# Patient Record
Sex: Female | Born: 1945 | Race: White | Hispanic: No | Marital: Married | Smoking: Never smoker
Health system: Southern US, Community
[De-identification: ages and names within clinical notes are randomized; demographics above are authoritative.]

## PROBLEM LIST (undated history)

## (undated) DIAGNOSIS — R51 Headache: Secondary | ICD-10-CM

## (undated) DIAGNOSIS — E7800 Pure hypercholesterolemia, unspecified: Secondary | ICD-10-CM

## (undated) HISTORY — PX: OTHER SURGICAL HISTORY: SHX169

## (undated) HISTORY — PX: TUBAL LIGATION: SHX77

## (undated) HISTORY — PX: TRIGGER FINGER RELEASE: SHX641

---

## 2000-12-15 ENCOUNTER — Other Ambulatory Visit: Admission: RE | Admit: 2000-12-15 | Discharge: 2000-12-15 | Payer: Self-pay | Admitting: Family Medicine

## 2000-12-20 ENCOUNTER — Ambulatory Visit (HOSPITAL_COMMUNITY): Admission: RE | Admit: 2000-12-20 | Discharge: 2000-12-20 | Payer: Self-pay | Admitting: Family Medicine

## 2000-12-20 ENCOUNTER — Encounter: Payer: Self-pay | Admitting: Family Medicine

## 2002-02-28 ENCOUNTER — Encounter: Payer: Self-pay | Admitting: Family Medicine

## 2002-02-28 ENCOUNTER — Ambulatory Visit (HOSPITAL_COMMUNITY): Admission: RE | Admit: 2002-02-28 | Discharge: 2002-02-28 | Payer: Self-pay | Admitting: Family Medicine

## 2003-03-04 ENCOUNTER — Encounter: Payer: Self-pay | Admitting: Internal Medicine

## 2003-03-04 ENCOUNTER — Ambulatory Visit (HOSPITAL_COMMUNITY): Admission: RE | Admit: 2003-03-04 | Discharge: 2003-03-04 | Payer: Self-pay | Admitting: Internal Medicine

## 2003-03-18 ENCOUNTER — Ambulatory Visit (HOSPITAL_COMMUNITY): Admission: RE | Admit: 2003-03-18 | Discharge: 2003-03-18 | Payer: Self-pay | Admitting: Internal Medicine

## 2003-03-20 ENCOUNTER — Other Ambulatory Visit: Admission: RE | Admit: 2003-03-20 | Discharge: 2003-03-20 | Payer: Self-pay | Admitting: Obstetrics and Gynecology

## 2003-07-01 ENCOUNTER — Ambulatory Visit (HOSPITAL_COMMUNITY): Admission: RE | Admit: 2003-07-01 | Discharge: 2003-07-01 | Payer: Self-pay | Admitting: Internal Medicine

## 2004-03-05 ENCOUNTER — Ambulatory Visit (HOSPITAL_COMMUNITY): Admission: RE | Admit: 2004-03-05 | Discharge: 2004-03-05 | Payer: Self-pay | Admitting: Internal Medicine

## 2005-02-22 ENCOUNTER — Other Ambulatory Visit: Admission: RE | Admit: 2005-02-22 | Discharge: 2005-02-22 | Payer: Self-pay | Admitting: Dermatology

## 2005-04-27 ENCOUNTER — Ambulatory Visit (HOSPITAL_COMMUNITY): Admission: RE | Admit: 2005-04-27 | Discharge: 2005-04-27 | Payer: Self-pay | Admitting: Obstetrics and Gynecology

## 2005-09-03 ENCOUNTER — Ambulatory Visit (HOSPITAL_COMMUNITY): Admission: RE | Admit: 2005-09-03 | Discharge: 2005-09-03 | Payer: Self-pay | Admitting: Internal Medicine

## 2006-05-30 ENCOUNTER — Ambulatory Visit (HOSPITAL_COMMUNITY): Admission: RE | Admit: 2006-05-30 | Discharge: 2006-05-30 | Payer: Self-pay | Admitting: Obstetrics and Gynecology

## 2007-06-01 ENCOUNTER — Ambulatory Visit (HOSPITAL_COMMUNITY): Admission: RE | Admit: 2007-06-01 | Discharge: 2007-06-01 | Payer: Self-pay | Admitting: Obstetrics and Gynecology

## 2008-12-03 ENCOUNTER — Ambulatory Visit (HOSPITAL_COMMUNITY): Admission: RE | Admit: 2008-12-03 | Discharge: 2008-12-03 | Payer: Self-pay | Admitting: Obstetrics and Gynecology

## 2010-01-23 ENCOUNTER — Ambulatory Visit (HOSPITAL_COMMUNITY): Admission: RE | Admit: 2010-01-23 | Discharge: 2010-01-23 | Payer: Self-pay | Admitting: Unknown Physician Specialty

## 2010-05-06 ENCOUNTER — Ambulatory Visit (HOSPITAL_COMMUNITY)
Admission: RE | Admit: 2010-05-06 | Discharge: 2010-05-06 | Payer: Self-pay | Source: Home / Self Care | Attending: Unknown Physician Specialty | Admitting: Unknown Physician Specialty

## 2010-10-09 NOTE — Op Note (Signed)
NAME:  Sue Evans, Sue Evans                        ACCOUNT NO.:  [id number]   MEDICAL RECORD NO.:  [id number]                   PATIENT TYPE:  AMB   LOCATION:  DAY                                  FACILITY:  [facility]   PHYSICIAN:  R. Roetta Sessions, M.D.              DATE OF BIRTH:  19-Jun-1945   DATE OF PROCEDURE:  03/18/2003  DATE OF DISCHARGE:                                 OPERATIVE REPORT   PROCEDURE:  EGD followed by colonoscopy.   INDICATIONS FOR PROCEDURE:  The patient is a 65 year old lady, who was found  to have mild microcytic anemia and three out of three Hemoccult cards  returned positive.  She has been on iron supplementation.  H. pylori  serologies were positive through [employer]. Sherwood Gambler, M.D.'s office.  EGD and  colonoscopy are now being done.  This approach has been discussed with the  patient previously and again today at the bedside.  Potential risks,  benefits, and alternatives have been reviewed.  Please see my dictated  consultation note for more information.   PROCEDURE NOTE:  O2 saturations, blood pressure, pulse, and respirations  were monitored throughout the entire procedure.   CONSCIOUS SEDATION:  Demerol 75 mg IV, Versed 4 mg IV in divided doses.   INSTRUMENT:  Olympus video chip adult gastroscope and colonoscope.   ESOPHAGOGASTRODUODENOSCOPY FINDINGS:  Examination of the tubular esophagus  revealed no mucosal abnormalities.  EG junction easily traversed.   STOMACH:  The gastric cavity was emptied, insufflated well with air.  Thorough examination of the gastric mucosa, including retroflexed view of  the proximal stomach and esophagogastric junction demonstrated only a couple  of small antral polyps (3-4 mm).  There was a small hiatal hernia.  Gastric  mucosa otherwise appeared normal.  Pylorus patent and easily traversed.   DUODENUM:  The bulb and second portion appeared normal.   THERAPY/DIAGNOSTIC MANEUVERS PERFORMED:  None.   The patient tolerated  the procedure well and was prepared for colonoscopy.  Digital rectal exam revealed no abnormalities or endoscopic findings.  The  prep was good.   RECTUM:  Examination of the rectal mucosa including retroflexed view in the  anal verge revealed no abnormalities.   COLON:  Colonic mucosa was surveyed from the rectosigmoid junction through  the left transverse and right colon to the area of the appendiceal orifice,  ileocecal valve, and cecum.  These structures were well seen and  photographed for the record.  Colonic mucosa to the cecum appeared normal.  From the level of the cecum and ileocecal valve, the scope was slowly  withdrawn, and previously mentioned mucosal surfaces were again seen, and no  abnormalities were observed.  The patient tolerated both procedures well and  was reacted in endoscopy.   IMPRESSION:  EGD:  A couple of very small antral polyps, small hiatal  hernia.  Remainder of upper gastrointestinal tract appeared normal.  Colonoscopy findings:  Normal rectum, normal colon.   RECOMMENDATIONS:  1. Today's findings are reassuring.  She does have positive H. pylori     serologies, and she is being treated for H. pylori, H. pylori class I     carcinogen, and these will be treated even though she does not have any     endoscopic evidence of any inflammatory changes involving the gastric     mucosa.  2. Prev-Pak x 14 days.  3. Have this nice lady to come back to see [place] in the office in six weeks     just to see how she is doing.  We will recheck a CBC at that time.  If     she were to display evidence of further gastrointestinal bleeding, would     consider [facility]'s imaging capsule study.      ___________________________________________                                            Jonathon Bellows, M.D.   RMR/MEDQ  D:  03/18/2003  T:  03/18/2003  Job:  [id number]   cc:   Madelin Rear. Sherwood Gambler, M.D.  [address]  [place]  [place] [address]  Fax: [phone]

## 2011-03-19 ENCOUNTER — Other Ambulatory Visit (HOSPITAL_COMMUNITY): Payer: Self-pay | Admitting: Obstetrics and Gynecology

## 2011-03-19 DIAGNOSIS — Z139 Encounter for screening, unspecified: Secondary | ICD-10-CM

## 2011-05-21 ENCOUNTER — Other Ambulatory Visit (HOSPITAL_COMMUNITY): Payer: Self-pay | Admitting: Obstetrics and Gynecology

## 2011-05-21 ENCOUNTER — Ambulatory Visit (HOSPITAL_COMMUNITY)
Admission: RE | Admit: 2011-05-21 | Discharge: 2011-05-21 | Disposition: A | Discharge status: Home / Self Care | Payer: PRIVATE HEALTH INSURANCE | Source: Ambulatory Visit | Attending: Obstetrics and Gynecology | Admitting: Obstetrics and Gynecology

## 2011-05-21 DIAGNOSIS — Z139 Encounter for screening, unspecified: Secondary | ICD-10-CM

## 2011-05-21 DIAGNOSIS — Z09 Encounter for follow-up examination after completed treatment for conditions other than malignant neoplasm: Secondary | ICD-10-CM

## 2011-06-09 ENCOUNTER — Other Ambulatory Visit (HOSPITAL_COMMUNITY): Payer: Self-pay | Admitting: Obstetrics and Gynecology

## 2011-06-09 ENCOUNTER — Ambulatory Visit (HOSPITAL_COMMUNITY)
Admission: RE | Admit: 2011-06-09 | Discharge: 2011-06-09 | Disposition: A | Discharge status: Home / Self Care | Payer: PRIVATE HEALTH INSURANCE | Source: Ambulatory Visit | Attending: Obstetrics and Gynecology | Admitting: Obstetrics and Gynecology

## 2011-06-09 DIAGNOSIS — Z09 Encounter for follow-up examination after completed treatment for conditions other than malignant neoplasm: Secondary | ICD-10-CM

## 2011-06-09 DIAGNOSIS — N63 Unspecified lump in unspecified breast: Secondary | ICD-10-CM | POA: Insufficient documentation

## 2012-04-11 LAB — CBC
HCT: 39 %
Hemoglobin: 13.2 g/dL (ref 12.0–16.0)

## 2012-05-09 ENCOUNTER — Telehealth: Payer: Self-pay

## 2012-05-09 NOTE — Telephone Encounter (Signed)
Pt was referred by Dr. Sherwood Gambler for screening colonoscopy. She had her last one in 03/18/2003 with Dr. Jena Gauss when she had an EGD. She is not having any problems now and I informed her that probably her insurance would not cover until 02/2013. She said she will wait til then and call if she has any new problems.

## 2012-05-10 NOTE — Telephone Encounter (Signed)
This patient has h/o microcytic anemia back in 2004 when we saw her for her procedures.   I would recommend we verify with PCP that patient is not anemic or heme positive.

## 2012-05-15 NOTE — Telephone Encounter (Signed)
Faxed the info to Dr. Sherwood Gambler to send back a reply.

## 2012-05-15 NOTE — Telephone Encounter (Signed)
Lab results placed on Leslie's cart.

## 2012-05-15 NOTE — Telephone Encounter (Signed)
Reviewed labs from PCP dated 04/11/12. Her H/H were 13.2/38.8.   No signs of anemia. Patient can wait until 10 years if she would like.  Other option, is to have her discuss with her insurance company. They may cover her screening colonoscopy any time in 2014 and not make her wait till 02/2013.

## 2012-05-16 NOTE — Telephone Encounter (Signed)
LMOM to call.

## 2012-05-16 NOTE — Telephone Encounter (Signed)
Returned pt's call and LMOM again to call.

## 2012-05-16 NOTE — Telephone Encounter (Signed)
Pt returned call and was informed. She will think about it and let us know if she decides to do earlier.

## 2012-06-20 ENCOUNTER — Other Ambulatory Visit (HOSPITAL_COMMUNITY): Payer: Self-pay | Admitting: Internal Medicine

## 2012-06-20 DIAGNOSIS — Z139 Encounter for screening, unspecified: Secondary | ICD-10-CM

## 2012-06-22 ENCOUNTER — Ambulatory Visit (HOSPITAL_COMMUNITY)
Admission: RE | Admit: 2012-06-22 | Discharge: 2012-06-22 | Disposition: A | Discharge status: Home / Self Care | Payer: PRIVATE HEALTH INSURANCE | Source: Ambulatory Visit | Attending: Internal Medicine | Admitting: Internal Medicine

## 2012-06-22 DIAGNOSIS — Z1231 Encounter for screening mammogram for malignant neoplasm of breast: Secondary | ICD-10-CM | POA: Insufficient documentation

## 2012-06-22 DIAGNOSIS — Z139 Encounter for screening, unspecified: Secondary | ICD-10-CM

## 2013-02-14 ENCOUNTER — Telehealth: Payer: Self-pay

## 2013-02-15 ENCOUNTER — Other Ambulatory Visit: Payer: Self-pay

## 2013-02-15 DIAGNOSIS — Z1211 Encounter for screening for malignant neoplasm of colon: Secondary | ICD-10-CM

## 2013-02-16 ENCOUNTER — Encounter (HOSPITAL_COMMUNITY): Payer: Self-pay | Admitting: Pharmacy Technician

## 2013-02-20 MED ORDER — PEG-KCL-NACL-NASULF-NA ASC-C 100 G PO SOLR: 1.0000 | ORAL | Status: DC

## 2013-02-20 NOTE — Telephone Encounter (Signed)
Gastroenterology Pre-Procedure Review  Request Date: 02/14/2013 Requesting Physician: Dr. Sherwood Gambler  PATIENT REVIEW QUESTIONS: The patient responded to the following health history questions as indicated:    1. Diabetes Melitis: no 2. Joint replacements in the past 12 months: no 3. Major health problems in the past 3 months: no 4. Has an artificial valve or MVP: no 5. Has a defibrillator: no 6. Has been advised in past to take antibiotics in advance of a procedure like teeth cleaning: no    MEDICATIONS & ALLERGIES:    Patient reports the following regarding taking any blood thinners:   Plavix? no Aspirin? no Coumadin? no  Patient confirms/reports the following medications:  Current Outpatient Prescriptions  Medication Sig Dispense Refill  . calcium carbonate (OS-CAL) 1250 MG chewable tablet Chew 1 tablet by mouth daily.      Marland Kitchen lisinopril (PRINIVIL,ZESTRIL) 10 MG tablet Take 10 mg by mouth daily.      Marland Kitchen omega-3 acid ethyl esters (LOVAZA) 1 G capsule Take 1 g by mouth daily.      . pravastatin (PRAVACHOL) 20 MG tablet Take 20 mg by mouth daily.       No current facility-administered medications for this visit.    Patient confirms/reports the following allergies:  No Known Allergies  No orders of the defined types were placed in this encounter.    AUTHORIZATION INFORMATION Primary Insurance:   ID #:   Group #:  Pre-Cert / Auth required:  Pre-Cert / Auth #:   Secondary Insurance:   ID #:   Group #:  Pre-Cert / Auth required: Pre-Cert / Auth #:   SCHEDULE INFORMATION: Procedure has been scheduled as follows:  Date: 03/05/2013        Time:  9:30 AM Location: [facility] Short Stay  This Gastroenterology Pre-Precedure Review Form is being routed to the following provider(s): R. Roetta Sessions, MD

## 2013-02-20 NOTE — Telephone Encounter (Signed)
Ok to schedule.

## 2013-02-20 NOTE — Telephone Encounter (Signed)
Rx sent to the pharmacy and instructions mailed to pt.  

## 2013-02-27 ENCOUNTER — Telehealth: Payer: Self-pay

## 2013-02-27 NOTE — Telephone Encounter (Signed)
I called [employer] at [phone]. Went through the automation and NO PA IS REQUIRED for screening colonoscopy.  I called [employer] ( Secondary) at [phone] and spoke to [place] who said that a PA is not required for a screening colonoscopy.

## 2013-03-05 ENCOUNTER — Ambulatory Visit (HOSPITAL_COMMUNITY)
Admission: RE | Admit: 2013-03-05 | Discharge: 2013-03-05 | Disposition: A | Discharge status: Home Health | Payer: Medicare PPO | Source: Ambulatory Visit | Attending: Internal Medicine | Admitting: Internal Medicine

## 2013-03-05 ENCOUNTER — Encounter (HOSPITAL_COMMUNITY): Payer: Self-pay | Admitting: *Deleted

## 2013-03-05 ENCOUNTER — Encounter (HOSPITAL_COMMUNITY)
Admission: RE | Disposition: A | Discharge status: Home Health | Payer: Self-pay | Source: Ambulatory Visit | Attending: Internal Medicine

## 2013-03-05 DIAGNOSIS — K5730 Diverticulosis of large intestine without perforation or abscess without bleeding: Secondary | ICD-10-CM

## 2013-03-05 DIAGNOSIS — Z1211 Encounter for screening for malignant neoplasm of colon: Secondary | ICD-10-CM | POA: Insufficient documentation

## 2013-03-05 DIAGNOSIS — K648 Other hemorrhoids: Secondary | ICD-10-CM

## 2013-03-05 HISTORY — PX: COLONOSCOPY: SHX5424

## 2013-03-05 HISTORY — DX: Headache: R51

## 2013-03-05 HISTORY — DX: Pure hypercholesterolemia, unspecified: E78.00

## 2013-03-05 SURGERY — COLONOSCOPY
Anesthesia: Moderate Sedation

## 2013-03-05 MED ORDER — STERILE WATER FOR IRRIGATION IR SOLN
Status: DC | PRN
  Administered 2013-03-05: 10:00:00

## 2013-03-05 MED ORDER — ONDANSETRON HCL 4 MG/2ML IJ SOLN
INTRAMUSCULAR | Status: AC
  Filled 2013-03-05: qty 2

## 2013-03-05 MED ORDER — MEPERIDINE HCL 100 MG/ML IJ SOLN
INTRAMUSCULAR | Status: AC
  Filled 2013-03-05: qty 2

## 2013-03-05 MED ORDER — MIDAZOLAM HCL 5 MG/5ML IJ SOLN
INTRAMUSCULAR | Status: AC
  Filled 2013-03-05: qty 10

## 2013-03-05 MED ORDER — MEPERIDINE HCL 100 MG/ML IJ SOLN
INTRAMUSCULAR | Status: DC | PRN
  Administered 2013-03-05: 50 mg via INTRAVENOUS
  Administered 2013-03-05: 25 mg via INTRAVENOUS

## 2013-03-05 MED ORDER — SODIUM CHLORIDE 0.9 % IV SOLN
INTRAVENOUS | Status: DC
  Administered 2013-03-05: 1000 mL via INTRAVENOUS

## 2013-03-05 MED ORDER — MIDAZOLAM HCL 5 MG/5ML IJ SOLN
INTRAMUSCULAR | Status: DC | PRN
  Administered 2013-03-05: 1 mg via INTRAVENOUS
  Administered 2013-03-05: 2 mg via INTRAVENOUS

## 2013-03-05 MED ORDER — MIDAZOLAM HCL 5 MG/5ML IJ SOLN
INTRAMUSCULAR | Status: AC
  Filled 2013-03-05: qty 5

## 2013-03-05 MED ORDER — ONDANSETRON HCL 4 MG/2ML IJ SOLN
INTRAMUSCULAR | Status: DC | PRN
  Administered 2013-03-05: 4 mg via INTRAVENOUS

## 2013-03-05 NOTE — Op Note (Signed)
[facility] [address] [place], [address]   COLONOSCOPY PROCEDURE REPORT  PATIENT: Sue Evans, Sue Evans  MR#:         [id number] BIRTHDATE: 21-Feb-1946 , 67  yrs. old GENDER: Female ENDOSCOPIST: R.  Roetta Sessions, MD FACP FACG REFERRED BY:  Artis Delay, M.D. PROCEDURE DATE:  03/05/2013 PROCEDURE:     Screening colonoscopy  INDICATIONS: Average risk colorectal cancer screening examination  INFORMED CONSENT:  The risks, benefits, alternatives and imponderables including but not limited to bleeding, perforation as well as the possibility of a missed lesion have been reviewed.  The potential for biopsy, lesion removal, etc. have also been discussed.  Questions have been answered.  All parties agreeable. Please see the history and physical in the medical record for more information.  MEDICATIONS: Versed 3 mg IV and Demerol 75 mg IV in divided doses. Zofran 4 mg IV  DESCRIPTION OF PROCEDURE:  After a digital rectal exam was performed, the EC-3890Li ([id number])  colonoscope was advanced from the anus through the rectum and colon to the area of the cecum, ileocecal valve and appendiceal orifice.  The cecum was deeply intubated.  These structures were well-seen and photographed for the record.  From the level of the cecum and ileocecal valve, the scope was slowly and cautiously withdrawn.  The mucosal surfaces were carefully surveyed utilizing scope tip deflection to facilitate fold flattening as needed.  The scope was pulled down into the rectum where a thorough examination including retroflexion was performed.    FINDINGS:  Adequate preparation. Minimal internal hemorrhoids; otherwise, normal rectum. Scattered sigmoid diverticula; the remainder of the colonic mucosa appeared normal.  THERAPEUTIC / DIAGNOSTIC MANEUVERS PERFORMED:  None  COMPLICATIONS: none  CECAL WITHDRAWAL TIME:  none  IMPRESSION:  Colonic diverticulosis  RECOMMENDATIONS: Return for one  more screening colonoscopy in 10 years.   _______________________________ eSigned:  R. Roetta Sessions, MD FACP [facility] 03/05/2013 10:07 AM   CC:

## 2013-03-05 NOTE — H&P (Signed)
Primary Care Physician:  Cassell Smiles., MD Primary Gastroenterologist:  Dr. Jena Gauss  Pre-Procedure History & Physical: HPI:  Sue Evans is a 67 y.o. female is here for a screening colonoscopy. Reported negative colonoscopy 10 years ago (records not available). Patient presents for a screening colonoscopy at this time. No bowel symptoms. No family history colon polyps or colon cancer.  Past Medical History  Diagnosis Date  . Dysrhythmia   . Headache(784.0)   . Hypercholesterolemia     Past Surgical History  Procedure Laterality Date  . Trigger finger release    . Tubal ligation    . 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 Authorizing Provider  calcium carbonate (OS-CAL) 1250 MG chewable tablet Chew 1 tablet by mouth daily.   Yes Historical Provider, MD  lisinopril (PRINIVIL,ZESTRIL) 10 MG tablet Take 10 mg by mouth daily.   Yes Historical Provider, MD  omega-3 acid ethyl esters (LOVAZA) 1 G capsule Take 1 g by mouth daily.   Yes Historical Provider, MD  peg 3350 powder (MOVIPREP) 100 G SOLR Take 1 kit (200 g total) by mouth as directed. 02/20/13  Yes Corbin Ade, MD  pravastatin (PRAVACHOL) 20 MG tablet Take 20 mg by mouth daily.   Yes Historical Provider, MD    Allergies as of 02/15/2013  . (Not on File)    Family History  Problem Relation Age of Onset  . Heart disease Mother   . Stroke Father   . Atrial fibrillation Father   . Heart disease Sister   . Stroke Sister     History   Social History  . Marital Status: Married    Spouse Name: N/A    Number of Children: N/A  . Years of Education: N/A   Occupational History  . Not on file.   Social History Main Topics  . Smoking status: Never Smoker   . Smokeless tobacco: Not on file  . Alcohol Use: No  . Drug Use: No  . Sexual Activity: Not on file   Other Topics Concern  . Not on file   Social History Narrative  . No  narrative on file    Review of Systems: See HPI, otherwise negative ROS  Physical Exam: BP 123/71  Pulse 56  Temp(Src) 97.8 F (36.6 C) (Oral)  Resp 20  Ht 5\' 4"  (1.626 m)  Wt 165 lb (74.844 kg)  BMI 28.31 kg/m2  SpO2 93% General:   Alert,  Well-developed, well-nourished, pleasant and cooperative in NAD Head:  Normocephalic and atraumatic. Eyes:  Sclera clear, no icterus.   Conjunctiva pink. Ears:  Normal auditory acuity. Nose:  No deformity, discharge,  or lesions. Mouth:  No deformity or lesions, dentition normal. Neck:  Supple; no masses or thyromegaly. Lungs:  Clear throughout to auscultation.   No wheezes, crackles, or rhonchi. No acute distress. Heart:  Regular rate and rhythm; no murmurs, clicks, rubs,  or gallops. Abdomen:  Soft, nontender and nondistended. No masses, hepatosplenomegaly or hernias noted. Normal bowel sounds, without guarding, and without rebound.   Msk:  Symmetrical without gross deformities. Normal posture. Pulses:  Normal pulses noted. Extremities:  Without clubbing or edema. Neurologic:  Alert and  oriented x4;  grossly normal neurologically. Skin:  Intact without significant lesions or rashes. Cervical Nodes:  No significant cervical adenopathy. Psych:  Alert and cooperative. Normal mood and affect.  Impression/Plan: [name] is now here  to undergo a screening colonoscopy.  Average risk screening examination  Risks, benefits, limitations, imponderables and alternatives regarding colonoscopy have been reviewed with the patient. Questions have been answered. All parties agreeable.

## 2013-03-06 ENCOUNTER — Encounter (HOSPITAL_COMMUNITY): Payer: Self-pay | Admitting: Internal Medicine

## 2013-07-19 ENCOUNTER — Other Ambulatory Visit (HOSPITAL_COMMUNITY): Payer: Self-pay | Admitting: Internal Medicine

## 2013-07-19 DIAGNOSIS — Z1231 Encounter for screening mammogram for malignant neoplasm of breast: Secondary | ICD-10-CM

## 2013-07-30 ENCOUNTER — Ambulatory Visit (HOSPITAL_COMMUNITY)
Admission: RE | Admit: 2013-07-30 | Discharge: 2013-07-30 | Disposition: A | Discharge status: Home / Self Care | Payer: Medicare PPO | Source: Ambulatory Visit | Attending: Internal Medicine | Admitting: Internal Medicine

## 2013-07-30 DIAGNOSIS — Z1231 Encounter for screening mammogram for malignant neoplasm of breast: Secondary | ICD-10-CM | POA: Insufficient documentation

## 2014-08-27 DIAGNOSIS — L570 Actinic keratosis: Secondary | ICD-10-CM | POA: Diagnosis not present

## 2014-08-27 DIAGNOSIS — D235 Other benign neoplasm of skin of trunk: Secondary | ICD-10-CM | POA: Diagnosis not present

## 2014-08-27 DIAGNOSIS — D485 Neoplasm of uncertain behavior of skin: Secondary | ICD-10-CM | POA: Diagnosis not present

## 2014-09-09 ENCOUNTER — Other Ambulatory Visit (HOSPITAL_COMMUNITY): Payer: Self-pay | Admitting: Internal Medicine

## 2014-09-09 DIAGNOSIS — Z1231 Encounter for screening mammogram for malignant neoplasm of breast: Secondary | ICD-10-CM

## 2014-10-09 ENCOUNTER — Ambulatory Visit (HOSPITAL_COMMUNITY)
Admission: RE | Admit: 2014-10-09 | Discharge: 2014-10-09 | Disposition: A | Discharge status: Home / Self Care | Payer: Medicare PPO | Source: Ambulatory Visit | Attending: Internal Medicine | Admitting: Internal Medicine

## 2014-10-09 DIAGNOSIS — Z1231 Encounter for screening mammogram for malignant neoplasm of breast: Secondary | ICD-10-CM | POA: Insufficient documentation

## 2014-11-01 ENCOUNTER — Ambulatory Visit (HOSPITAL_COMMUNITY)
Admission: RE | Admit: 2014-11-01 | Discharge: 2014-11-01 | Disposition: A | Discharge status: Home / Self Care | Payer: Medicare PPO | Source: Ambulatory Visit | Attending: Physician Assistant | Admitting: Physician Assistant

## 2014-11-01 ENCOUNTER — Other Ambulatory Visit (HOSPITAL_COMMUNITY): Payer: Self-pay | Admitting: Physician Assistant

## 2014-11-01 DIAGNOSIS — M25461 Effusion, right knee: Secondary | ICD-10-CM | POA: Diagnosis not present

## 2014-11-01 DIAGNOSIS — M25561 Pain in right knee: Secondary | ICD-10-CM | POA: Diagnosis not present

## 2014-11-01 DIAGNOSIS — S8991XA Unspecified injury of right lower leg, initial encounter: Secondary | ICD-10-CM | POA: Diagnosis not present

## 2015-02-25 DIAGNOSIS — L570 Actinic keratosis: Secondary | ICD-10-CM | POA: Diagnosis not present

## 2015-02-25 DIAGNOSIS — D1800 Hemangioma unspecified site: Secondary | ICD-10-CM | POA: Diagnosis not present

## 2015-02-25 DIAGNOSIS — L84 Corns and callosities: Secondary | ICD-10-CM | POA: Diagnosis not present

## 2015-04-16 DIAGNOSIS — M1711 Unilateral primary osteoarthritis, right knee: Secondary | ICD-10-CM | POA: Diagnosis not present

## 2015-04-16 DIAGNOSIS — M25561 Pain in right knee: Secondary | ICD-10-CM | POA: Diagnosis not present

## 2015-04-21 ENCOUNTER — Other Ambulatory Visit (HOSPITAL_COMMUNITY): Payer: Self-pay | Admitting: Orthopedic Surgery

## 2015-04-21 DIAGNOSIS — M25561 Pain in right knee: Secondary | ICD-10-CM

## 2015-05-05 ENCOUNTER — Ambulatory Visit (HOSPITAL_COMMUNITY)
Admission: RE | Admit: 2015-05-05 | Discharge: 2015-05-05 | Disposition: A | Discharge status: Home / Self Care | Payer: Medicare PPO | Source: Ambulatory Visit | Attending: Orthopedic Surgery | Admitting: Orthopedic Surgery

## 2015-05-05 DIAGNOSIS — X58XXXA Exposure to other specified factors, initial encounter: Secondary | ICD-10-CM | POA: Diagnosis not present

## 2015-05-05 DIAGNOSIS — M25561 Pain in right knee: Secondary | ICD-10-CM

## 2015-05-05 DIAGNOSIS — W1830XA Fall on same level, unspecified, initial encounter: Secondary | ICD-10-CM | POA: Diagnosis not present

## 2015-05-05 DIAGNOSIS — S83281A Other tear of lateral meniscus, current injury, right knee, initial encounter: Secondary | ICD-10-CM | POA: Insufficient documentation

## 2015-05-05 DIAGNOSIS — S83241A Other tear of medial meniscus, current injury, right knee, initial encounter: Secondary | ICD-10-CM | POA: Diagnosis not present

## 2015-05-08 DIAGNOSIS — S83281D Other tear of lateral meniscus, current injury, right knee, subsequent encounter: Secondary | ICD-10-CM | POA: Diagnosis not present

## 2015-05-28 DIAGNOSIS — Y939 Activity, unspecified: Secondary | ICD-10-CM | POA: Diagnosis not present

## 2015-05-28 DIAGNOSIS — M6751 Plica syndrome, right knee: Secondary | ICD-10-CM | POA: Diagnosis not present

## 2015-05-28 DIAGNOSIS — Y929 Unspecified place or not applicable: Secondary | ICD-10-CM | POA: Diagnosis not present

## 2015-05-28 DIAGNOSIS — Y999 Unspecified external cause status: Secondary | ICD-10-CM | POA: Diagnosis not present

## 2015-05-28 DIAGNOSIS — S83241A Other tear of medial meniscus, current injury, right knee, initial encounter: Secondary | ICD-10-CM | POA: Diagnosis not present

## 2015-05-28 DIAGNOSIS — M94261 Chondromalacia, right knee: Secondary | ICD-10-CM | POA: Diagnosis not present

## 2015-05-28 DIAGNOSIS — S83281A Other tear of lateral meniscus, current injury, right knee, initial encounter: Secondary | ICD-10-CM | POA: Diagnosis not present

## 2015-06-06 DIAGNOSIS — R2689 Other abnormalities of gait and mobility: Secondary | ICD-10-CM | POA: Diagnosis not present

## 2015-06-06 DIAGNOSIS — M25561 Pain in right knee: Secondary | ICD-10-CM | POA: Diagnosis not present

## 2015-06-06 DIAGNOSIS — S83281D Other tear of lateral meniscus, current injury, right knee, subsequent encounter: Secondary | ICD-10-CM | POA: Diagnosis not present

## 2015-06-06 DIAGNOSIS — S83241D Other tear of medial meniscus, current injury, right knee, subsequent encounter: Secondary | ICD-10-CM | POA: Diagnosis not present

## 2015-06-10 DIAGNOSIS — S83281D Other tear of lateral meniscus, current injury, right knee, subsequent encounter: Secondary | ICD-10-CM | POA: Diagnosis not present

## 2015-06-10 DIAGNOSIS — M25561 Pain in right knee: Secondary | ICD-10-CM | POA: Diagnosis not present

## 2015-06-10 DIAGNOSIS — R2689 Other abnormalities of gait and mobility: Secondary | ICD-10-CM | POA: Diagnosis not present

## 2015-06-10 DIAGNOSIS — S83241D Other tear of medial meniscus, current injury, right knee, subsequent encounter: Secondary | ICD-10-CM | POA: Diagnosis not present

## 2015-06-12 DIAGNOSIS — S83281D Other tear of lateral meniscus, current injury, right knee, subsequent encounter: Secondary | ICD-10-CM | POA: Diagnosis not present

## 2015-06-12 DIAGNOSIS — M25561 Pain in right knee: Secondary | ICD-10-CM | POA: Diagnosis not present

## 2015-06-12 DIAGNOSIS — R2689 Other abnormalities of gait and mobility: Secondary | ICD-10-CM | POA: Diagnosis not present

## 2015-06-12 DIAGNOSIS — S83241D Other tear of medial meniscus, current injury, right knee, subsequent encounter: Secondary | ICD-10-CM | POA: Diagnosis not present

## 2015-06-17 DIAGNOSIS — R2689 Other abnormalities of gait and mobility: Secondary | ICD-10-CM | POA: Diagnosis not present

## 2015-06-17 DIAGNOSIS — S83241D Other tear of medial meniscus, current injury, right knee, subsequent encounter: Secondary | ICD-10-CM | POA: Diagnosis not present

## 2015-06-17 DIAGNOSIS — M25561 Pain in right knee: Secondary | ICD-10-CM | POA: Diagnosis not present

## 2015-06-17 DIAGNOSIS — S83281D Other tear of lateral meniscus, current injury, right knee, subsequent encounter: Secondary | ICD-10-CM | POA: Diagnosis not present

## 2015-06-19 DIAGNOSIS — S83281D Other tear of lateral meniscus, current injury, right knee, subsequent encounter: Secondary | ICD-10-CM | POA: Diagnosis not present

## 2015-06-19 DIAGNOSIS — S83241D Other tear of medial meniscus, current injury, right knee, subsequent encounter: Secondary | ICD-10-CM | POA: Diagnosis not present

## 2015-06-19 DIAGNOSIS — R2689 Other abnormalities of gait and mobility: Secondary | ICD-10-CM | POA: Diagnosis not present

## 2015-06-19 DIAGNOSIS — M25561 Pain in right knee: Secondary | ICD-10-CM | POA: Diagnosis not present

## 2015-06-23 DIAGNOSIS — Z23 Encounter for immunization: Secondary | ICD-10-CM | POA: Diagnosis not present

## 2015-06-23 DIAGNOSIS — Z6829 Body mass index (BMI) 29.0-29.9, adult: Secondary | ICD-10-CM | POA: Diagnosis not present

## 2015-06-23 DIAGNOSIS — Z79899 Other long term (current) drug therapy: Secondary | ICD-10-CM | POA: Diagnosis not present

## 2015-06-23 DIAGNOSIS — E663 Overweight: Secondary | ICD-10-CM | POA: Diagnosis not present

## 2015-06-23 DIAGNOSIS — Z1389 Encounter for screening for other disorder: Secondary | ICD-10-CM | POA: Diagnosis not present

## 2015-06-23 DIAGNOSIS — E782 Mixed hyperlipidemia: Secondary | ICD-10-CM | POA: Diagnosis not present

## 2015-06-23 DIAGNOSIS — I10 Essential (primary) hypertension: Secondary | ICD-10-CM | POA: Diagnosis not present

## 2015-06-23 DIAGNOSIS — Z0001 Encounter for general adult medical examination with abnormal findings: Secondary | ICD-10-CM | POA: Diagnosis not present

## 2015-06-24 DIAGNOSIS — S83281D Other tear of lateral meniscus, current injury, right knee, subsequent encounter: Secondary | ICD-10-CM | POA: Diagnosis not present

## 2015-06-24 DIAGNOSIS — M25561 Pain in right knee: Secondary | ICD-10-CM | POA: Diagnosis not present

## 2015-06-24 DIAGNOSIS — R2689 Other abnormalities of gait and mobility: Secondary | ICD-10-CM | POA: Diagnosis not present

## 2015-06-24 DIAGNOSIS — S83241D Other tear of medial meniscus, current injury, right knee, subsequent encounter: Secondary | ICD-10-CM | POA: Diagnosis not present

## 2015-06-26 DIAGNOSIS — S83241D Other tear of medial meniscus, current injury, right knee, subsequent encounter: Secondary | ICD-10-CM | POA: Diagnosis not present

## 2015-06-26 DIAGNOSIS — M25561 Pain in right knee: Secondary | ICD-10-CM | POA: Diagnosis not present

## 2015-06-26 DIAGNOSIS — S83281D Other tear of lateral meniscus, current injury, right knee, subsequent encounter: Secondary | ICD-10-CM | POA: Diagnosis not present

## 2015-06-26 DIAGNOSIS — R2689 Other abnormalities of gait and mobility: Secondary | ICD-10-CM | POA: Diagnosis not present

## 2015-07-01 DIAGNOSIS — S83241D Other tear of medial meniscus, current injury, right knee, subsequent encounter: Secondary | ICD-10-CM | POA: Diagnosis not present

## 2015-07-01 DIAGNOSIS — R2689 Other abnormalities of gait and mobility: Secondary | ICD-10-CM | POA: Diagnosis not present

## 2015-07-01 DIAGNOSIS — M25561 Pain in right knee: Secondary | ICD-10-CM | POA: Diagnosis not present

## 2015-07-01 DIAGNOSIS — S83281D Other tear of lateral meniscus, current injury, right knee, subsequent encounter: Secondary | ICD-10-CM | POA: Diagnosis not present

## 2015-07-02 ENCOUNTER — Other Ambulatory Visit (HOSPITAL_COMMUNITY): Payer: Self-pay | Admitting: Internal Medicine

## 2015-07-02 ENCOUNTER — Ambulatory Visit (HOSPITAL_COMMUNITY)
Admission: RE | Admit: 2015-07-02 | Discharge: 2015-07-02 | Disposition: A | Discharge status: Home / Self Care | Payer: Medicare PPO | Source: Ambulatory Visit | Attending: Internal Medicine | Admitting: Internal Medicine

## 2015-07-02 DIAGNOSIS — Z6828 Body mass index (BMI) 28.0-28.9, adult: Secondary | ICD-10-CM | POA: Diagnosis not present

## 2015-07-02 DIAGNOSIS — I82812 Embolism and thrombosis of superficial veins of left lower extremities: Secondary | ICD-10-CM | POA: Insufficient documentation

## 2015-07-02 DIAGNOSIS — M79605 Pain in left leg: Secondary | ICD-10-CM | POA: Insufficient documentation

## 2015-07-02 DIAGNOSIS — M7122 Synovial cyst of popliteal space [Baker], left knee: Secondary | ICD-10-CM | POA: Diagnosis not present

## 2015-07-02 DIAGNOSIS — M1991 Primary osteoarthritis, unspecified site: Secondary | ICD-10-CM | POA: Diagnosis not present

## 2015-07-02 DIAGNOSIS — M7989 Other specified soft tissue disorders: Secondary | ICD-10-CM | POA: Diagnosis not present

## 2015-07-02 DIAGNOSIS — M79662 Pain in left lower leg: Secondary | ICD-10-CM | POA: Insufficient documentation

## 2015-07-02 DIAGNOSIS — Z1389 Encounter for screening for other disorder: Secondary | ICD-10-CM | POA: Diagnosis not present

## 2015-07-02 DIAGNOSIS — I10 Essential (primary) hypertension: Secondary | ICD-10-CM | POA: Diagnosis not present

## 2015-08-21 DIAGNOSIS — Z23 Encounter for immunization: Secondary | ICD-10-CM | POA: Diagnosis not present

## 2015-08-21 DIAGNOSIS — M25562 Pain in left knee: Secondary | ICD-10-CM | POA: Diagnosis not present

## 2015-08-26 ENCOUNTER — Other Ambulatory Visit (HOSPITAL_COMMUNITY): Payer: Self-pay | Admitting: Orthopedic Surgery

## 2015-08-26 DIAGNOSIS — M25562 Pain in left knee: Principal | ICD-10-CM

## 2015-08-26 DIAGNOSIS — G8929 Other chronic pain: Secondary | ICD-10-CM

## 2015-09-02 ENCOUNTER — Ambulatory Visit (HOSPITAL_COMMUNITY)
Admission: RE | Admit: 2015-09-02 | Discharge: 2015-09-02 | Disposition: A | Discharge status: Home / Self Care | Payer: Medicare PPO | Source: Ambulatory Visit | Attending: Orthopedic Surgery | Admitting: Orthopedic Surgery

## 2015-09-02 DIAGNOSIS — M84362A Stress fracture, left tibia, initial encounter for fracture: Secondary | ICD-10-CM | POA: Diagnosis not present

## 2015-09-02 DIAGNOSIS — M660 Rupture of popliteal cyst: Secondary | ICD-10-CM | POA: Insufficient documentation

## 2015-09-02 DIAGNOSIS — M25462 Effusion, left knee: Secondary | ICD-10-CM | POA: Diagnosis not present

## 2015-09-02 DIAGNOSIS — G8929 Other chronic pain: Secondary | ICD-10-CM

## 2015-09-02 DIAGNOSIS — M25562 Pain in left knee: Secondary | ICD-10-CM | POA: Insufficient documentation

## 2015-09-02 DIAGNOSIS — M179 Osteoarthritis of knee, unspecified: Secondary | ICD-10-CM | POA: Diagnosis not present

## 2015-09-02 DIAGNOSIS — M23201 Derangement of unspecified lateral meniscus due to old tear or injury, left knee: Secondary | ICD-10-CM | POA: Insufficient documentation

## 2015-09-02 DIAGNOSIS — M659 Synovitis and tenosynovitis, unspecified: Secondary | ICD-10-CM | POA: Insufficient documentation

## 2015-09-02 DIAGNOSIS — M23262 Derangement of other lateral meniscus due to old tear or injury, left knee: Secondary | ICD-10-CM | POA: Diagnosis not present

## 2015-09-04 DIAGNOSIS — S83282D Other tear of lateral meniscus, current injury, left knee, subsequent encounter: Secondary | ICD-10-CM | POA: Diagnosis not present

## 2015-09-10 DIAGNOSIS — M23322 Other meniscus derangements, posterior horn of medial meniscus, left knee: Secondary | ICD-10-CM | POA: Diagnosis not present

## 2015-09-10 DIAGNOSIS — M6752 Plica syndrome, left knee: Secondary | ICD-10-CM | POA: Diagnosis not present

## 2015-09-10 DIAGNOSIS — S83242A Other tear of medial meniscus, current injury, left knee, initial encounter: Secondary | ICD-10-CM | POA: Diagnosis not present

## 2015-09-10 DIAGNOSIS — M94262 Chondromalacia, left knee: Secondary | ICD-10-CM | POA: Diagnosis not present

## 2015-09-10 DIAGNOSIS — G8918 Other acute postprocedural pain: Secondary | ICD-10-CM | POA: Diagnosis not present

## 2015-09-10 DIAGNOSIS — M2242 Chondromalacia patellae, left knee: Secondary | ICD-10-CM | POA: Diagnosis not present

## 2015-09-10 DIAGNOSIS — Y939 Activity, unspecified: Secondary | ICD-10-CM | POA: Diagnosis not present

## 2015-09-10 DIAGNOSIS — S83272A Complex tear of lateral meniscus, current injury, left knee, initial encounter: Secondary | ICD-10-CM | POA: Diagnosis not present

## 2015-09-10 DIAGNOSIS — Y929 Unspecified place or not applicable: Secondary | ICD-10-CM | POA: Diagnosis not present

## 2015-09-10 DIAGNOSIS — M659 Synovitis and tenosynovitis, unspecified: Secondary | ICD-10-CM | POA: Diagnosis not present

## 2015-09-10 DIAGNOSIS — Y999 Unspecified external cause status: Secondary | ICD-10-CM | POA: Diagnosis not present

## 2015-09-10 DIAGNOSIS — X58XXXA Exposure to other specified factors, initial encounter: Secondary | ICD-10-CM | POA: Diagnosis not present

## 2015-09-23 DIAGNOSIS — M25662 Stiffness of left knee, not elsewhere classified: Secondary | ICD-10-CM | POA: Diagnosis not present

## 2015-09-23 DIAGNOSIS — R2689 Other abnormalities of gait and mobility: Secondary | ICD-10-CM | POA: Diagnosis not present

## 2015-09-23 DIAGNOSIS — S83272D Complex tear of lateral meniscus, current injury, left knee, subsequent encounter: Secondary | ICD-10-CM | POA: Diagnosis not present

## 2015-09-23 DIAGNOSIS — M6281 Muscle weakness (generalized): Secondary | ICD-10-CM | POA: Diagnosis not present

## 2015-09-25 DIAGNOSIS — S83272D Complex tear of lateral meniscus, current injury, left knee, subsequent encounter: Secondary | ICD-10-CM | POA: Diagnosis not present

## 2015-09-25 DIAGNOSIS — R2689 Other abnormalities of gait and mobility: Secondary | ICD-10-CM | POA: Diagnosis not present

## 2015-09-25 DIAGNOSIS — M25662 Stiffness of left knee, not elsewhere classified: Secondary | ICD-10-CM | POA: Diagnosis not present

## 2015-09-25 DIAGNOSIS — M6281 Muscle weakness (generalized): Secondary | ICD-10-CM | POA: Diagnosis not present

## 2015-09-30 DIAGNOSIS — R2689 Other abnormalities of gait and mobility: Secondary | ICD-10-CM | POA: Diagnosis not present

## 2015-09-30 DIAGNOSIS — M6281 Muscle weakness (generalized): Secondary | ICD-10-CM | POA: Diagnosis not present

## 2015-09-30 DIAGNOSIS — S83272D Complex tear of lateral meniscus, current injury, left knee, subsequent encounter: Secondary | ICD-10-CM | POA: Diagnosis not present

## 2015-09-30 DIAGNOSIS — M25662 Stiffness of left knee, not elsewhere classified: Secondary | ICD-10-CM | POA: Diagnosis not present

## 2015-10-02 DIAGNOSIS — R2689 Other abnormalities of gait and mobility: Secondary | ICD-10-CM | POA: Diagnosis not present

## 2015-10-02 DIAGNOSIS — S83272D Complex tear of lateral meniscus, current injury, left knee, subsequent encounter: Secondary | ICD-10-CM | POA: Diagnosis not present

## 2015-10-02 DIAGNOSIS — M6281 Muscle weakness (generalized): Secondary | ICD-10-CM | POA: Diagnosis not present

## 2015-10-02 DIAGNOSIS — M25662 Stiffness of left knee, not elsewhere classified: Secondary | ICD-10-CM | POA: Diagnosis not present

## 2015-10-07 DIAGNOSIS — R2689 Other abnormalities of gait and mobility: Secondary | ICD-10-CM | POA: Diagnosis not present

## 2015-10-07 DIAGNOSIS — M25662 Stiffness of left knee, not elsewhere classified: Secondary | ICD-10-CM | POA: Diagnosis not present

## 2015-10-07 DIAGNOSIS — M6281 Muscle weakness (generalized): Secondary | ICD-10-CM | POA: Diagnosis not present

## 2015-10-07 DIAGNOSIS — S83272D Complex tear of lateral meniscus, current injury, left knee, subsequent encounter: Secondary | ICD-10-CM | POA: Diagnosis not present

## 2015-10-09 DIAGNOSIS — R2689 Other abnormalities of gait and mobility: Secondary | ICD-10-CM | POA: Diagnosis not present

## 2015-10-09 DIAGNOSIS — M6281 Muscle weakness (generalized): Secondary | ICD-10-CM | POA: Diagnosis not present

## 2015-10-09 DIAGNOSIS — M25662 Stiffness of left knee, not elsewhere classified: Secondary | ICD-10-CM | POA: Diagnosis not present

## 2015-10-09 DIAGNOSIS — S83272D Complex tear of lateral meniscus, current injury, left knee, subsequent encounter: Secondary | ICD-10-CM | POA: Diagnosis not present

## 2015-10-14 DIAGNOSIS — M25662 Stiffness of left knee, not elsewhere classified: Secondary | ICD-10-CM | POA: Diagnosis not present

## 2015-10-14 DIAGNOSIS — M6281 Muscle weakness (generalized): Secondary | ICD-10-CM | POA: Diagnosis not present

## 2015-10-14 DIAGNOSIS — R2689 Other abnormalities of gait and mobility: Secondary | ICD-10-CM | POA: Diagnosis not present

## 2015-10-14 DIAGNOSIS — S83272D Complex tear of lateral meniscus, current injury, left knee, subsequent encounter: Secondary | ICD-10-CM | POA: Diagnosis not present

## 2015-10-16 DIAGNOSIS — M25662 Stiffness of left knee, not elsewhere classified: Secondary | ICD-10-CM | POA: Diagnosis not present

## 2015-10-16 DIAGNOSIS — R2689 Other abnormalities of gait and mobility: Secondary | ICD-10-CM | POA: Diagnosis not present

## 2015-10-16 DIAGNOSIS — S83272D Complex tear of lateral meniscus, current injury, left knee, subsequent encounter: Secondary | ICD-10-CM | POA: Diagnosis not present

## 2015-10-16 DIAGNOSIS — M6281 Muscle weakness (generalized): Secondary | ICD-10-CM | POA: Diagnosis not present

## 2015-10-21 ENCOUNTER — Other Ambulatory Visit (HOSPITAL_COMMUNITY): Payer: Self-pay | Admitting: Internal Medicine

## 2015-10-21 DIAGNOSIS — M25662 Stiffness of left knee, not elsewhere classified: Secondary | ICD-10-CM | POA: Diagnosis not present

## 2015-10-21 DIAGNOSIS — Z1231 Encounter for screening mammogram for malignant neoplasm of breast: Secondary | ICD-10-CM

## 2015-10-21 DIAGNOSIS — M6281 Muscle weakness (generalized): Secondary | ICD-10-CM | POA: Diagnosis not present

## 2015-10-21 DIAGNOSIS — R2689 Other abnormalities of gait and mobility: Secondary | ICD-10-CM | POA: Diagnosis not present

## 2015-10-21 DIAGNOSIS — S83272D Complex tear of lateral meniscus, current injury, left knee, subsequent encounter: Secondary | ICD-10-CM | POA: Diagnosis not present

## 2015-10-24 DIAGNOSIS — M6281 Muscle weakness (generalized): Secondary | ICD-10-CM | POA: Diagnosis not present

## 2015-10-24 DIAGNOSIS — M25662 Stiffness of left knee, not elsewhere classified: Secondary | ICD-10-CM | POA: Diagnosis not present

## 2015-10-24 DIAGNOSIS — S83272D Complex tear of lateral meniscus, current injury, left knee, subsequent encounter: Secondary | ICD-10-CM | POA: Diagnosis not present

## 2015-10-24 DIAGNOSIS — R2689 Other abnormalities of gait and mobility: Secondary | ICD-10-CM | POA: Diagnosis not present

## 2015-10-27 ENCOUNTER — Ambulatory Visit (HOSPITAL_COMMUNITY)
Admission: RE | Admit: 2015-10-27 | Discharge: 2015-10-27 | Disposition: A | Discharge status: Home / Self Care | Payer: Medicare PPO | Source: Ambulatory Visit | Attending: Internal Medicine | Admitting: Internal Medicine

## 2015-10-27 DIAGNOSIS — Z1231 Encounter for screening mammogram for malignant neoplasm of breast: Secondary | ICD-10-CM

## 2016-04-26 DIAGNOSIS — L570 Actinic keratosis: Secondary | ICD-10-CM | POA: Diagnosis not present

## 2016-04-26 DIAGNOSIS — L821 Other seborrheic keratosis: Secondary | ICD-10-CM | POA: Diagnosis not present

## 2016-04-26 DIAGNOSIS — D1800 Hemangioma unspecified site: Secondary | ICD-10-CM | POA: Diagnosis not present

## 2016-09-16 ENCOUNTER — Other Ambulatory Visit (HOSPITAL_COMMUNITY): Payer: Self-pay | Admitting: Internal Medicine

## 2016-09-16 DIAGNOSIS — Z1231 Encounter for screening mammogram for malignant neoplasm of breast: Secondary | ICD-10-CM

## 2016-09-17 ENCOUNTER — Emergency Department (HOSPITAL_COMMUNITY): Payer: Medicare PPO

## 2016-09-17 ENCOUNTER — Encounter (HOSPITAL_COMMUNITY): Payer: Self-pay

## 2016-09-17 ENCOUNTER — Emergency Department (HOSPITAL_COMMUNITY)
Admission: EM | Admit: 2016-09-17 | Discharge: 2016-09-17 | Disposition: A | Discharge status: Home / Self Care | Payer: Medicare PPO | Attending: Emergency Medicine | Admitting: Emergency Medicine

## 2016-09-17 DIAGNOSIS — S4991XA Unspecified injury of right shoulder and upper arm, initial encounter: Secondary | ICD-10-CM | POA: Diagnosis present

## 2016-09-17 DIAGNOSIS — Y999 Unspecified external cause status: Secondary | ICD-10-CM | POA: Insufficient documentation

## 2016-09-17 DIAGNOSIS — Z79899 Other long term (current) drug therapy: Secondary | ICD-10-CM | POA: Diagnosis not present

## 2016-09-17 DIAGNOSIS — Y92009 Unspecified place in unspecified non-institutional (private) residence as the place of occurrence of the external cause: Secondary | ICD-10-CM | POA: Diagnosis not present

## 2016-09-17 DIAGNOSIS — Y9389 Activity, other specified: Secondary | ICD-10-CM | POA: Diagnosis not present

## 2016-09-17 DIAGNOSIS — W1781XA Fall down embankment (hill), initial encounter: Secondary | ICD-10-CM | POA: Insufficient documentation

## 2016-09-17 DIAGNOSIS — S0083XA Contusion of other part of head, initial encounter: Secondary | ICD-10-CM | POA: Diagnosis not present

## 2016-09-17 DIAGNOSIS — S59901A Unspecified injury of right elbow, initial encounter: Secondary | ICD-10-CM | POA: Diagnosis not present

## 2016-09-17 DIAGNOSIS — S42254A Nondisplaced fracture of greater tuberosity of right humerus, initial encounter for closed fracture: Secondary | ICD-10-CM | POA: Diagnosis not present

## 2016-09-17 DIAGNOSIS — M25511 Pain in right shoulder: Secondary | ICD-10-CM | POA: Diagnosis not present

## 2016-09-17 DIAGNOSIS — M25521 Pain in right elbow: Secondary | ICD-10-CM | POA: Diagnosis not present

## 2016-09-17 DIAGNOSIS — S5001XA Contusion of right elbow, initial encounter: Secondary | ICD-10-CM | POA: Diagnosis not present

## 2016-09-17 DIAGNOSIS — W19XXXA Unspecified fall, initial encounter: Secondary | ICD-10-CM

## 2016-09-17 MED ORDER — HYDROCODONE-ACETAMINOPHEN 5-325 MG PO TABS: 1.0000 | ORAL_TABLET | ORAL | 0 refills | Status: DC | PRN

## 2016-09-17 NOTE — ED Triage Notes (Signed)
Pt reports fell down a hill hit R shoulder and hit head Has abrasion to her R elbow

## 2016-09-17 NOTE — Discharge Instructions (Signed)
You have fractured your humerus as we discussed. Otherwise x-rays of your elbow and face showed no fracture. Sling, ice pack, follow-up with orthopedics. Phone number given. Prescription for pain medicine.

## 2016-09-17 NOTE — ED Triage Notes (Signed)
Pt reports that she was digging up a flower while on a hill, she lost balanced and tumbled forward. Landing on right arm/shoulder and right forehead on pavement. Denies LOC. Has abraision to right elbow and hematoma to right forehead

## 2016-09-17 NOTE — ED Provider Notes (Signed)
[facility] DEPT Provider Note   CSN: [id number] Arrival date & time: 09/17/16  1035     History   Chief Complaint Chief Complaint  Patient presents with  . Fall  . Arm Pain  . Head Injury    HPI Sue Evans is a 71 y.o. female.  Accidental trip and fall down a hill at home this morning. Now with pain on right face, right shoulder, right elbow. No loss of consciousness or neurological deficits. No neck pain. Severity of pain is moderate.      Past Medical History:  Diagnosis Date  . Dysrhythmia   . Headache(784.0)   . Hypercholesterolemia     There are no active problems to display for this patient.   Past Surgical History:  Procedure Laterality Date  .  torn meniscus sx on right and left knee    . COLONOSCOPY N/A 03/05/2013   Procedure: COLONOSCOPY;  Surgeon: Daneil Dolin, MD;  Location: [facility] ENDO SUITE;  Service: Endoscopy;  Laterality: N/A;  9:30 per doris patient reqstd   . 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 TRIGGER FINGER RELEASE    . TUBAL LIGATION      OB History    No data available       Home Medications    Prior to Admission medications   Medication Sig Start Date End Date Taking? Authorizing Provider  calcium carbonate (OS-CAL) 1250 MG chewable tablet Chew 1 tablet by mouth daily.    Historical Provider, MD  HYDROcodone-acetaminophen (NORCO/VICODIN) 5-325 MG tablet Take 1 tablet by mouth every 4 (four) hours as needed. 09/17/16   Nat Christen, MD  lisinopril (PRINIVIL,ZESTRIL) 10 MG tablet Take 10 mg by mouth daily.    Historical Provider, MD  omega-3 acid ethyl esters (LOVAZA) 1 G capsule Take 1 g by mouth daily.    Historical Provider, MD  peg 3350 powder (MOVIPREP) 100 G SOLR Take 1 kit (200 g total) by mouth as directed. 02/20/13   Daneil Dolin, MD  pravastatin (PRAVACHOL) 20 MG tablet Take 20 mg by mouth daily.    Historical Provider, MD    Family History Family History  Problem Relation Age of Onset  . Heart disease  Mother   . Stroke Father   . Atrial fibrillation Father   . Heart disease Sister   . Stroke Sister     Social History Social History  Substance Use Topics  . Smoking status: Never Smoker  . Smokeless tobacco: Never Used  . Alcohol use No     Allergies   Patient has no known allergies.   Review of Systems Review of Systems  All other systems reviewed and are negative.    Physical Exam Updated Vital Signs BP (!) 168/104 (BP Location: Left Arm)   Pulse 85   Temp 98.4 F (36.9 C) (Oral)   Resp 20   Wt 165 lb (74.8 kg)   SpO2 98%   BMI 28.32 kg/m   Physical Exam  Constitutional: She is oriented to person, place, and time. She appears well-developed and well-nourished.  HENT:  Head: Normocephalic.  Ecchymosis on superior lateral orbital area  Eyes: Conjunctivae are normal.  Neck: Neck supple.  Cardiovascular: Normal rate and regular rhythm.   Pulmonary/Chest: Effort normal and breath sounds normal.  Abdominal: Soft. Bowel sounds are normal.  Musculoskeletal:  Right upper extremity: Tender over her greater tuberosity of humerus and posterior elbow.  Neurological: She is alert and oriented to person, place, and  time.  Skin: Skin is warm and dry.  Psychiatric: She has a normal mood and affect. Her behavior is normal.  Nursing note and vitals reviewed.    ED Treatments / Results  Labs (all labs ordered are listed, but only abnormal results are displayed) Labs Reviewed - No data to display  EKG  EKG Interpretation None       Radiology Dg Shoulder Right  Result Date: 09/17/2016 CLINICAL DATA:  Right shoulder pain EXAM: RIGHT SHOULDER - 2+ VIEW COMPARISON:  None. FINDINGS: Nondisplaced fracture of the right greater tuberosity. There is no evidence of arthropathy or other focal bone abnormality. Soft tissues are unremarkable. IMPRESSION: Nondisplaced fracture of the right greater tuberosity. Electronically Signed   By: Kathreen Devoid   On: 09/17/2016 11:52    Dg Elbow Complete Right  Result Date: 09/17/2016 CLINICAL DATA:  Recent fall with right elbow pain, initial encounter EXAM: RIGHT ELBOW - COMPLETE 3+ VIEW COMPARISON:  None. FINDINGS: There is no evidence of fracture, dislocation, or joint effusion. There is no evidence of arthropathy or other focal bone abnormality. Soft tissues are unremarkable. IMPRESSION: No acute abnormality noted. Electronically Signed   By: Inez Catalina M.D.   On: 09/17/2016 13:19   Ct Maxillofacial Wo Cm  Result Date: 09/17/2016 CLINICAL DATA:  The patient fell while working outside today with a blow to the forehead. Initial encounter. EXAM: CT MAXILLOFACIAL WITHOUT CONTRAST TECHNIQUE: Multidetector CT imaging of the maxillofacial structures was performed. Multiplanar CT image reconstructions were also generated. A small metallic BB was placed on the right temple in order to reliably differentiate right from left. COMPARISON:  None. FINDINGS: Osseous: No fracture or mandibular dislocation. No destructive process. Orbits: Negative. No traumatic or inflammatory finding. Sinuses: Clear. Soft tissues: Soft tissue contusion is seen lateral and superior to the right eye. Limited intracranial: No acute abnormality. IMPRESSION: Soft tissue contusion on the right side of the face without underlying fracture or other acute abnormality. Electronically Signed   By: Inge Rise M.D.   On: 09/17/2016 13:01    Procedures Procedures (including critical care time)  Medications Ordered in ED Medications - No data to display   Initial Impression / Assessment and Plan / ED Course  I have reviewed the triage vital signs and the nursing notes.  Pertinent labs & imaging results that were available during my care of the patient were reviewed by me and considered in my medical decision making (see chart for details).     Patient is exhibiting no neurological deficits. No neck pain. Maxillofacial CT negative. Right elbow films  negative. Right shoulder films reveal a nondisplaced fracture of the greater tuberosity. Sling, referral to orthopedics, Vicodin for pain.  Final Clinical Impressions(s) / ED Diagnoses   Final diagnoses:  Fall, initial encounter  Closed nondisplaced fracture of greater tuberosity of right humerus, initial encounter  Contusion of right elbow, initial encounter  Contusion of face, initial encounter    New Prescriptions New Prescriptions   HYDROCODONE-ACETAMINOPHEN (NORCO/VICODIN) 5-325 MG TABLET    Take 1 tablet by mouth every 4 (four) hours as needed.     Nat Christen, MD 09/17/16 [phone]

## 2016-09-17 NOTE — ED Notes (Signed)
Pt's wound to R hand and R elbow cleaned at this time.

## 2016-09-22 DIAGNOSIS — S42254A Nondisplaced fracture of greater tuberosity of right humerus, initial encounter for closed fracture: Secondary | ICD-10-CM | POA: Diagnosis not present

## 2016-09-22 DIAGNOSIS — Z9181 History of falling: Secondary | ICD-10-CM | POA: Diagnosis not present

## 2016-10-13 DIAGNOSIS — S42254D Nondisplaced fracture of greater tuberosity of right humerus, subsequent encounter for fracture with routine healing: Secondary | ICD-10-CM | POA: Diagnosis not present

## 2016-10-13 DIAGNOSIS — M19011 Primary osteoarthritis, right shoulder: Secondary | ICD-10-CM | POA: Diagnosis not present

## 2016-10-20 DIAGNOSIS — S42254A Nondisplaced fracture of greater tuberosity of right humerus, initial encounter for closed fracture: Secondary | ICD-10-CM | POA: Diagnosis not present

## 2016-10-20 DIAGNOSIS — M25611 Stiffness of right shoulder, not elsewhere classified: Secondary | ICD-10-CM | POA: Diagnosis not present

## 2016-10-20 DIAGNOSIS — M6281 Muscle weakness (generalized): Secondary | ICD-10-CM | POA: Diagnosis not present

## 2016-10-26 DIAGNOSIS — M25611 Stiffness of right shoulder, not elsewhere classified: Secondary | ICD-10-CM | POA: Diagnosis not present

## 2016-10-26 DIAGNOSIS — S42254A Nondisplaced fracture of greater tuberosity of right humerus, initial encounter for closed fracture: Secondary | ICD-10-CM | POA: Diagnosis not present

## 2016-10-26 DIAGNOSIS — M6281 Muscle weakness (generalized): Secondary | ICD-10-CM | POA: Diagnosis not present

## 2016-10-28 ENCOUNTER — Ambulatory Visit (HOSPITAL_COMMUNITY): Payer: Medicare PPO

## 2016-10-28 DIAGNOSIS — M6281 Muscle weakness (generalized): Secondary | ICD-10-CM | POA: Diagnosis not present

## 2016-10-28 DIAGNOSIS — S42254A Nondisplaced fracture of greater tuberosity of right humerus, initial encounter for closed fracture: Secondary | ICD-10-CM | POA: Diagnosis not present

## 2016-10-28 DIAGNOSIS — M25611 Stiffness of right shoulder, not elsewhere classified: Secondary | ICD-10-CM | POA: Diagnosis not present

## 2016-11-02 DIAGNOSIS — S42254A Nondisplaced fracture of greater tuberosity of right humerus, initial encounter for closed fracture: Secondary | ICD-10-CM | POA: Diagnosis not present

## 2016-11-02 DIAGNOSIS — M6281 Muscle weakness (generalized): Secondary | ICD-10-CM | POA: Diagnosis not present

## 2016-11-02 DIAGNOSIS — M25611 Stiffness of right shoulder, not elsewhere classified: Secondary | ICD-10-CM | POA: Diagnosis not present

## 2016-11-03 DIAGNOSIS — S42254D Nondisplaced fracture of greater tuberosity of right humerus, subsequent encounter for fracture with routine healing: Secondary | ICD-10-CM | POA: Diagnosis not present

## 2016-11-03 DIAGNOSIS — M19011 Primary osteoarthritis, right shoulder: Secondary | ICD-10-CM | POA: Diagnosis not present

## 2016-11-04 DIAGNOSIS — M25611 Stiffness of right shoulder, not elsewhere classified: Secondary | ICD-10-CM | POA: Diagnosis not present

## 2016-11-04 DIAGNOSIS — S42254A Nondisplaced fracture of greater tuberosity of right humerus, initial encounter for closed fracture: Secondary | ICD-10-CM | POA: Diagnosis not present

## 2016-11-04 DIAGNOSIS — M6281 Muscle weakness (generalized): Secondary | ICD-10-CM | POA: Diagnosis not present

## 2016-11-08 DIAGNOSIS — M6281 Muscle weakness (generalized): Secondary | ICD-10-CM | POA: Diagnosis not present

## 2016-11-08 DIAGNOSIS — M25611 Stiffness of right shoulder, not elsewhere classified: Secondary | ICD-10-CM | POA: Diagnosis not present

## 2016-11-08 DIAGNOSIS — S42254A Nondisplaced fracture of greater tuberosity of right humerus, initial encounter for closed fracture: Secondary | ICD-10-CM | POA: Diagnosis not present

## 2016-11-12 DIAGNOSIS — M6281 Muscle weakness (generalized): Secondary | ICD-10-CM | POA: Diagnosis not present

## 2016-11-12 DIAGNOSIS — S42254A Nondisplaced fracture of greater tuberosity of right humerus, initial encounter for closed fracture: Secondary | ICD-10-CM | POA: Diagnosis not present

## 2016-11-12 DIAGNOSIS — M25611 Stiffness of right shoulder, not elsewhere classified: Secondary | ICD-10-CM | POA: Diagnosis not present

## 2016-11-16 DIAGNOSIS — M25611 Stiffness of right shoulder, not elsewhere classified: Secondary | ICD-10-CM | POA: Diagnosis not present

## 2016-11-16 DIAGNOSIS — S42254A Nondisplaced fracture of greater tuberosity of right humerus, initial encounter for closed fracture: Secondary | ICD-10-CM | POA: Diagnosis not present

## 2016-11-16 DIAGNOSIS — M6281 Muscle weakness (generalized): Secondary | ICD-10-CM | POA: Diagnosis not present

## 2016-11-18 DIAGNOSIS — S42254A Nondisplaced fracture of greater tuberosity of right humerus, initial encounter for closed fracture: Secondary | ICD-10-CM | POA: Diagnosis not present

## 2016-11-18 DIAGNOSIS — M6281 Muscle weakness (generalized): Secondary | ICD-10-CM | POA: Diagnosis not present

## 2016-11-18 DIAGNOSIS — M25611 Stiffness of right shoulder, not elsewhere classified: Secondary | ICD-10-CM | POA: Diagnosis not present

## 2016-11-23 DIAGNOSIS — M6281 Muscle weakness (generalized): Secondary | ICD-10-CM | POA: Diagnosis not present

## 2016-11-23 DIAGNOSIS — S42254A Nondisplaced fracture of greater tuberosity of right humerus, initial encounter for closed fracture: Secondary | ICD-10-CM | POA: Diagnosis not present

## 2016-11-23 DIAGNOSIS — M25611 Stiffness of right shoulder, not elsewhere classified: Secondary | ICD-10-CM | POA: Diagnosis not present

## 2016-11-25 DIAGNOSIS — S42254A Nondisplaced fracture of greater tuberosity of right humerus, initial encounter for closed fracture: Secondary | ICD-10-CM | POA: Diagnosis not present

## 2016-11-25 DIAGNOSIS — M25611 Stiffness of right shoulder, not elsewhere classified: Secondary | ICD-10-CM | POA: Diagnosis not present

## 2016-11-25 DIAGNOSIS — M6281 Muscle weakness (generalized): Secondary | ICD-10-CM | POA: Diagnosis not present

## 2016-11-30 DIAGNOSIS — M25611 Stiffness of right shoulder, not elsewhere classified: Secondary | ICD-10-CM | POA: Diagnosis not present

## 2016-11-30 DIAGNOSIS — S42254A Nondisplaced fracture of greater tuberosity of right humerus, initial encounter for closed fracture: Secondary | ICD-10-CM | POA: Diagnosis not present

## 2016-11-30 DIAGNOSIS — M6281 Muscle weakness (generalized): Secondary | ICD-10-CM | POA: Diagnosis not present

## 2016-12-02 DIAGNOSIS — S42254A Nondisplaced fracture of greater tuberosity of right humerus, initial encounter for closed fracture: Secondary | ICD-10-CM | POA: Diagnosis not present

## 2016-12-02 DIAGNOSIS — M25611 Stiffness of right shoulder, not elsewhere classified: Secondary | ICD-10-CM | POA: Diagnosis not present

## 2016-12-02 DIAGNOSIS — M6281 Muscle weakness (generalized): Secondary | ICD-10-CM | POA: Diagnosis not present

## 2016-12-07 DIAGNOSIS — S42254A Nondisplaced fracture of greater tuberosity of right humerus, initial encounter for closed fracture: Secondary | ICD-10-CM | POA: Diagnosis not present

## 2016-12-07 DIAGNOSIS — M6281 Muscle weakness (generalized): Secondary | ICD-10-CM | POA: Diagnosis not present

## 2016-12-07 DIAGNOSIS — M25611 Stiffness of right shoulder, not elsewhere classified: Secondary | ICD-10-CM | POA: Diagnosis not present

## 2016-12-09 DIAGNOSIS — S42254A Nondisplaced fracture of greater tuberosity of right humerus, initial encounter for closed fracture: Secondary | ICD-10-CM | POA: Diagnosis not present

## 2016-12-09 DIAGNOSIS — M6281 Muscle weakness (generalized): Secondary | ICD-10-CM | POA: Diagnosis not present

## 2016-12-09 DIAGNOSIS — M25611 Stiffness of right shoulder, not elsewhere classified: Secondary | ICD-10-CM | POA: Diagnosis not present

## 2016-12-14 DIAGNOSIS — M25611 Stiffness of right shoulder, not elsewhere classified: Secondary | ICD-10-CM | POA: Diagnosis not present

## 2016-12-14 DIAGNOSIS — S42254A Nondisplaced fracture of greater tuberosity of right humerus, initial encounter for closed fracture: Secondary | ICD-10-CM | POA: Diagnosis not present

## 2016-12-14 DIAGNOSIS — M6281 Muscle weakness (generalized): Secondary | ICD-10-CM | POA: Diagnosis not present

## 2016-12-16 DIAGNOSIS — S42254A Nondisplaced fracture of greater tuberosity of right humerus, initial encounter for closed fracture: Secondary | ICD-10-CM | POA: Diagnosis not present

## 2016-12-16 DIAGNOSIS — M25611 Stiffness of right shoulder, not elsewhere classified: Secondary | ICD-10-CM | POA: Diagnosis not present

## 2016-12-16 DIAGNOSIS — M6281 Muscle weakness (generalized): Secondary | ICD-10-CM | POA: Diagnosis not present

## 2016-12-20 DIAGNOSIS — S42254A Nondisplaced fracture of greater tuberosity of right humerus, initial encounter for closed fracture: Secondary | ICD-10-CM | POA: Diagnosis not present

## 2016-12-20 DIAGNOSIS — M25611 Stiffness of right shoulder, not elsewhere classified: Secondary | ICD-10-CM | POA: Diagnosis not present

## 2016-12-20 DIAGNOSIS — M6281 Muscle weakness (generalized): Secondary | ICD-10-CM | POA: Diagnosis not present

## 2016-12-24 DIAGNOSIS — S42254A Nondisplaced fracture of greater tuberosity of right humerus, initial encounter for closed fracture: Secondary | ICD-10-CM | POA: Diagnosis not present

## 2016-12-24 DIAGNOSIS — M25611 Stiffness of right shoulder, not elsewhere classified: Secondary | ICD-10-CM | POA: Diagnosis not present

## 2016-12-24 DIAGNOSIS — M6281 Muscle weakness (generalized): Secondary | ICD-10-CM | POA: Diagnosis not present

## 2016-12-28 DIAGNOSIS — M6281 Muscle weakness (generalized): Secondary | ICD-10-CM | POA: Diagnosis not present

## 2016-12-28 DIAGNOSIS — S42254A Nondisplaced fracture of greater tuberosity of right humerus, initial encounter for closed fracture: Secondary | ICD-10-CM | POA: Diagnosis not present

## 2016-12-28 DIAGNOSIS — M25611 Stiffness of right shoulder, not elsewhere classified: Secondary | ICD-10-CM | POA: Diagnosis not present

## 2016-12-30 DIAGNOSIS — S42254A Nondisplaced fracture of greater tuberosity of right humerus, initial encounter for closed fracture: Secondary | ICD-10-CM | POA: Diagnosis not present

## 2016-12-30 DIAGNOSIS — M6281 Muscle weakness (generalized): Secondary | ICD-10-CM | POA: Diagnosis not present

## 2016-12-30 DIAGNOSIS — M25611 Stiffness of right shoulder, not elsewhere classified: Secondary | ICD-10-CM | POA: Diagnosis not present

## 2017-01-04 DIAGNOSIS — M25611 Stiffness of right shoulder, not elsewhere classified: Secondary | ICD-10-CM | POA: Diagnosis not present

## 2017-01-04 DIAGNOSIS — S42254A Nondisplaced fracture of greater tuberosity of right humerus, initial encounter for closed fracture: Secondary | ICD-10-CM | POA: Diagnosis not present

## 2017-01-04 DIAGNOSIS — M6281 Muscle weakness (generalized): Secondary | ICD-10-CM | POA: Diagnosis not present

## 2017-01-07 DIAGNOSIS — M6281 Muscle weakness (generalized): Secondary | ICD-10-CM | POA: Diagnosis not present

## 2017-01-07 DIAGNOSIS — M25611 Stiffness of right shoulder, not elsewhere classified: Secondary | ICD-10-CM | POA: Diagnosis not present

## 2017-01-07 DIAGNOSIS — S42254A Nondisplaced fracture of greater tuberosity of right humerus, initial encounter for closed fracture: Secondary | ICD-10-CM | POA: Diagnosis not present

## 2017-01-11 DIAGNOSIS — S42254A Nondisplaced fracture of greater tuberosity of right humerus, initial encounter for closed fracture: Secondary | ICD-10-CM | POA: Diagnosis not present

## 2017-01-11 DIAGNOSIS — M25611 Stiffness of right shoulder, not elsewhere classified: Secondary | ICD-10-CM | POA: Diagnosis not present

## 2017-01-11 DIAGNOSIS — M6281 Muscle weakness (generalized): Secondary | ICD-10-CM | POA: Diagnosis not present

## 2017-01-26 ENCOUNTER — Other Ambulatory Visit (HOSPITAL_COMMUNITY): Payer: Self-pay | Admitting: Internal Medicine

## 2017-01-26 DIAGNOSIS — Z1231 Encounter for screening mammogram for malignant neoplasm of breast: Secondary | ICD-10-CM

## 2017-02-03 ENCOUNTER — Ambulatory Visit (HOSPITAL_COMMUNITY)
Admission: RE | Admit: 2017-02-03 | Discharge: 2017-02-03 | Disposition: A | Discharge status: Home / Self Care | Payer: Medicare PPO | Source: Ambulatory Visit | Attending: Internal Medicine | Admitting: Internal Medicine

## 2017-02-03 DIAGNOSIS — Z1231 Encounter for screening mammogram for malignant neoplasm of breast: Secondary | ICD-10-CM | POA: Diagnosis not present

## 2017-06-20 DIAGNOSIS — L570 Actinic keratosis: Secondary | ICD-10-CM | POA: Diagnosis not present

## 2017-06-20 DIAGNOSIS — D2239 Melanocytic nevi of other parts of face: Secondary | ICD-10-CM | POA: Diagnosis not present

## 2017-06-20 DIAGNOSIS — D1800 Hemangioma unspecified site: Secondary | ICD-10-CM | POA: Diagnosis not present

## 2017-06-20 DIAGNOSIS — D235 Other benign neoplasm of skin of trunk: Secondary | ICD-10-CM | POA: Diagnosis not present

## 2017-06-20 DIAGNOSIS — D485 Neoplasm of uncertain behavior of skin: Secondary | ICD-10-CM | POA: Diagnosis not present

## 2017-08-11 DIAGNOSIS — Z01419 Encounter for gynecological examination (general) (routine) without abnormal findings: Secondary | ICD-10-CM | POA: Diagnosis not present

## 2017-08-11 DIAGNOSIS — Z9189 Other specified personal risk factors, not elsewhere classified: Secondary | ICD-10-CM | POA: Diagnosis not present

## 2017-08-11 DIAGNOSIS — Z124 Encounter for screening for malignant neoplasm of cervix: Secondary | ICD-10-CM | POA: Diagnosis not present

## 2017-08-11 DIAGNOSIS — Z1212 Encounter for screening for malignant neoplasm of rectum: Secondary | ICD-10-CM | POA: Diagnosis not present

## 2017-08-17 DIAGNOSIS — Z1389 Encounter for screening for other disorder: Secondary | ICD-10-CM | POA: Diagnosis not present

## 2017-08-17 DIAGNOSIS — Z23 Encounter for immunization: Secondary | ICD-10-CM | POA: Diagnosis not present

## 2017-08-17 DIAGNOSIS — Z6828 Body mass index (BMI) 28.0-28.9, adult: Secondary | ICD-10-CM | POA: Diagnosis not present

## 2017-08-17 DIAGNOSIS — Z0000 Encounter for general adult medical examination without abnormal findings: Secondary | ICD-10-CM | POA: Diagnosis not present

## 2017-08-17 DIAGNOSIS — E6609 Other obesity due to excess calories: Secondary | ICD-10-CM | POA: Diagnosis not present

## 2017-08-17 DIAGNOSIS — E663 Overweight: Secondary | ICD-10-CM | POA: Diagnosis not present

## 2017-09-07 DIAGNOSIS — Z6828 Body mass index (BMI) 28.0-28.9, adult: Secondary | ICD-10-CM | POA: Diagnosis not present

## 2017-09-07 DIAGNOSIS — E663 Overweight: Secondary | ICD-10-CM | POA: Diagnosis not present

## 2017-09-07 DIAGNOSIS — L723 Sebaceous cyst: Secondary | ICD-10-CM | POA: Diagnosis not present

## 2017-09-29 ENCOUNTER — Ambulatory Visit (INDEPENDENT_AMBULATORY_CARE_PROVIDER_SITE_OTHER): Payer: Medicare PPO | Admitting: General Surgery

## 2017-09-29 ENCOUNTER — Encounter: Payer: Self-pay | Admitting: General Surgery

## 2017-09-29 VITALS — BP 150/80 | HR 69 | Temp 97.5°F | Resp 18 | Ht 63.0 in | Wt 168.0 lb

## 2017-09-29 DIAGNOSIS — L723 Sebaceous cyst: Secondary | ICD-10-CM | POA: Diagnosis not present

## 2017-09-29 NOTE — Patient Instructions (Signed)
Epidermal Cyst (Sebaceous cyst) An epidermal cyst is sometimes called an epidermal inclusion cyst or an infundibular cyst. It is a sac made of skin tissue. The sac contains a substance called keratin. Keratin is a protein that is normally secreted through the hair follicles. When keratin becomes trapped in the top layer of skin (epidermis), it can form an epidermal cyst. Epidermal cysts are usually found on the face, neck, trunk, and genitals. These cysts are usually harmless (benign), and they may not cause symptoms unless they become infected. It is important not to pop epidermal cysts yourself. What are the causes? This condition may be caused by:  A blocked hair follicle.  A hair that curls and re-enters the skin instead of growing straight out of the skin (ingrown hair).  A blocked pore.  Irritated skin.  An injury to the skin.  Certain conditions that are passed along from parent to child (inherited).  Human papillomavirus (HPV).  What increases the risk? The following factors may make you more likely to develop an epidermal cyst:  Having acne.  Being overweight.  Wearing tight clothing.  What are the signs or symptoms? The only symptom of this condition may be a small, painless lump underneath the skin. When an epidermal cyst becomes infected, symptoms may include:  Redness.  Inflammation.  Tenderness.  Warmth.  Fever.  Keratin draining from the cyst. Keratin may look like a grayish-white, bad-smelling substance.  Pus draining from the cyst.  How is this diagnosed? This condition is diagnosed with a physical exam. In some cases, you may have a sample of tissue (biopsy) taken from your cyst to be examined under a microscope or tested for bacteria. You may be referred to a health care provider who specializes in skin care (dermatologist). How is this treated? In many cases, epidermal cysts go away on their own without treatment. If a cyst becomes infected,  treatment may include:  Opening and draining the cyst. After draining, minor surgery to remove the rest of the cyst may be done.  Antibiotic medicine to help prevent infection.  Injections of medicines (steroids) that help to reduce inflammation.  Surgery to remove the cyst. Surgery may be done if: ? The cyst becomes large. ? The cyst bothers you. ? There is a chance that the cyst could turn into cancer.  Follow these instructions at home:  Take over-the-counter and prescription medicines only as told by your health care provider.  If you were prescribed an antibiotic, use it as told by your health care provider. Do not stop using the antibiotic even if you start to feel better.  Keep the area around your cyst clean and dry.  Wear loose, dry clothing.  Do not try to pop your cyst.  Avoid touching your cyst.  Check your cyst every day for signs of infection.  Keep all follow-up visits as told by your health care provider. This is important. How is this prevented?  Wear clean, dry, clothing.  Avoid wearing tight clothing.  Keep your skin clean and dry. Shower or take baths every day.  Wash your body with a benzoyl peroxide wash when you shower or bathe. Contact a health care provider if:  Your cyst develops symptoms of infection.  Your condition is not improving or is getting worse.  You develop a cyst that looks different from other cysts you have had.  You have a fever. Get help right away if:  Redness spreads from the cyst into the surrounding area. This  information is not intended to replace advice given to you by your health care provider. Make sure you discuss any questions you have with your health care provider. Document Released: 04/10/2004 Document Revised: 01/07/2016 Document Reviewed: 03/12/2015 Elsevier Interactive Patient Education  [employer].

## 2017-09-29 NOTE — Progress Notes (Signed)
[facility] History and Physical  Reason for Referral:Left posterior head sebaceous cyst  Referring Physician:  Dr. Gerarda Fraction   Chief Complaint    Cyst      Sue Evans is a 72 y.o. female.  HPI: Sue Evans is a 72 yo with HLD who is on cholesterol medication and prior history of HTN but is not off her lisinopril who comes in with a recent episode of an infected cyst on the left posterior head just behind her mastoid.  She reports that this area has been swollen and inflamed, but never drained and improved with antibiotics. Due to the irritation, she has been scratching at it and applying alcohol and peroxide and now the skin is dry and flaking.  She reports mostly itching at this time.  She has noticed having a bump in the region before but has never had it drained.   Her husband is scheduled for surgery in the upcoming weeks, and sounds like he is getting a VATS for a lung biopsy.    Past Medical History:  Diagnosis Date  . Headache(784.0)   . Hypercholesterolemia     Past Surgical History:  Procedure Laterality Date  .  torn meniscus sx on right and left knee    . COLONOSCOPY N/A 03/05/2013   Procedure: COLONOSCOPY;  Surgeon: Daneil Dolin, MD;  Location: [facility];  Service: Endoscopy;  Laterality: N/A;  9:30 per doris patient reqstd   . 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 TRIGGER FINGER RELEASE    . TUBAL LIGATION      Family History  Problem Relation Age of Onset  . Heart disease Mother   . Stroke Father   . Atrial fibrillation Father   . Heart disease Sister   . Stroke Sister     Social History   Tobacco Use  . Smoking status: Never Smoker  . Smokeless tobacco: Never Used  Substance Use Topics  . Alcohol use: No  . Drug use: No    Medications: I have reviewed the patient's current medications. Not taking the lisinopril, only taking cholesterol medication  Allergies as of 09/29/2017   No Known Allergies     Medication List         Accurate as of 09/29/17 11:59 PM. Always use your most recent med list.          calcium carbonate 1250 (500 Ca) MG chewable tablet Commonly known as:  OS-CAL Chew 1 tablet by mouth daily.   HYDROcodone-acetaminophen 5-325 MG tablet Commonly known as:  NORCO/VICODIN Take 1 tablet by mouth every 4 (four) hours as needed.   lisinopril 10 MG tablet Commonly known as:  PRINIVIL,ZESTRIL Take 10 mg by mouth daily.   omega-3 acid ethyl esters 1 g capsule Commonly known as:  LOVAZA Take 1 g by mouth daily.   peg 3350 powder 100 g Solr Commonly known as:  MOVIPREP Take 1 kit (200 g total) by mouth as directed.   pravastatin 20 MG tablet Commonly known as:  PRAVACHOL Take 20 mg by mouth daily.        ROS:  A comprehensive review of systems was negative except for: left posterior scalp with draining cyst/ itching  Blood pressure (!) 150/80, pulse 69, temperature (!) 97.5 F (36.4 C), resp. rate 18, height _0  (1.6 m), weight 168 lb (76.2 kg). Physical Exam  Constitutional: She is oriented to person, place, and time. She appears well-developed and well-nourished.  HENT:  Head: Normocephalic.  Left posterior scalp with 1.5cm area of dry skin, indented with some fluid, no drainage, at the bottom of hairline  Eyes: Pupils are equal, round, and reactive to light. EOM are normal.  Neck: Normal range of motion. Neck supple.  Cardiovascular: Normal rate and regular rhythm.  Musculoskeletal: Normal range of motion. She exhibits no edema.  Neurological: She is alert and oriented to person, place, and time.  Skin: Skin is warm and dry.  Psychiatric: She has a normal mood and affect. Her behavior is normal. Judgment and thought content normal.  Vitals reviewed.   Results: None   Assessment & Plan:  Sue Evans is a 72 y.o. female with a recently infected sebaceous cyst on the left posterior head. This is resolved. Some of the itching is likely from the peroxide/  alcohol she is applying to the skin and she has tried hydrocortisone and selsun blue without improvement. The antibiotics have cleared the infection and the area is no longer swollen. She wants to get this removed, but has to take care of her husband in the upcoming weeks.    -Plan for excision of the left posterior head cyst in the upcoming weeks/ months following her husband's surgery -May have another flare and will need antibiotics -Recommended stopping the peroxide/ alcohol and doing some Vaseline or coco butter to the skin in that region to see if that helps with the itching  -Patient will call when ready for excision   All questions were answered to the satisfaction of the patient.  The risk and benefits of excision of the cyst were discussed including but not limited to bleeding, infection, risk of recurrence, risk of being other etiology.  After careful consideration, Sue Evans has decided to proceed.    Virl Cagey 09/30/2017, 1:21 PM

## 2017-09-30 ENCOUNTER — Encounter: Payer: Self-pay | Admitting: General Surgery

## 2017-09-30 DIAGNOSIS — L723 Sebaceous cyst: Secondary | ICD-10-CM | POA: Insufficient documentation

## 2017-10-04 NOTE — H&P (Signed)
[facility] History and Physical  Reason for Referral:Left posterior head sebaceous cyst  Referring Physician:  Dr. Fusco   Chief Complaint    Cyst      Sue Evans is a 72 y.o. female.  HPI: Sue Evans is a 72 yo with HLD who is on cholesterol medication and prior history of HTN but is not off her lisinopril who comes in with a recent episode of an infected cyst on the left posterior head just behind her mastoid.  She reports that this area has been swollen and inflamed, but never drained and improved with antibiotics. Due to the irritation, she has been scratching at it and applying alcohol and peroxide and now the skin is dry and flaking.  She reports mostly itching at this time.  She has noticed having a bump in the region before but has never had it drained.   Her husband is scheduled for surgery in the upcoming weeks, and sounds like he is getting a VATS for a lung biopsy.    Past Medical History:  Diagnosis Date  . Headache(784.0)   . Hypercholesterolemia     Past Surgical History:  Procedure Laterality Date  .  torn meniscus sx on right and left knee    . COLONOSCOPY N/A 03/05/2013   Procedure: COLONOSCOPY;  Surgeon: Robert M Rourk, MD;  [facility];  Service: Endoscopy;  Laterality: N/A;  9:30 per doris patient reqstd   . 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 TRIGGER FINGER RELEASE    . TUBAL LIGATION      Family History  Problem Relation Age of Onset  . Heart disease Mother   . Stroke Father   . Atrial fibrillation Father   . Heart disease Sister   . Stroke Sister     Social History   Tobacco Use  . Smoking status: Never Smoker  . Smokeless tobacco: Never Used  Substance Use Topics  . Alcohol use: No  . Drug use: No    Medications: I have reviewed the patient's current medications. Not taking the lisinopril, only taking cholesterol medication  Allergies as of 09/29/2017   No Known Allergies     Medication List         Accurate as of 09/29/17 11:59 PM. Always use your most recent med list.          calcium carbonate 1250 (500 Ca) MG chewable tablet Commonly known as:  OS-CAL Chew 1 tablet by mouth daily.   HYDROcodone-acetaminophen 5-325 MG tablet Commonly known as:  NORCO/VICODIN Take 1 tablet by mouth every 4 (four) hours as needed.   lisinopril 10 MG tablet Commonly known as:  PRINIVIL,ZESTRIL Take 10 mg by mouth daily.   omega-3 acid ethyl esters 1 g capsule Commonly known as:  LOVAZA Take 1 g by mouth daily.   peg 3350 powder 100 g Solr Commonly known as:  MOVIPREP Take 1 kit (200 g total) by mouth as directed.   pravastatin 20 MG tablet Commonly known as:  PRAVACHOL Take 20 mg by mouth daily.        ROS:  A comprehensive review of systems was negative except for: left posterior scalp with draining cyst/ itching  Blood pressure (!) 150/80, pulse 69, temperature (!) 97.5 F (36.4 C), resp. rate 18, height 5' 3" (1.6 m), weight 168 lb (76.2 kg). Physical Exam  Constitutional: She is oriented to person, place, and time. She appears well-developed and well-nourished.  HENT:    Head: Normocephalic.  Left posterior scalp with 1.5cm area of dry skin, indented with some fluid, no drainage, at the bottom of hairline  Eyes: Pupils are equal, round, and reactive to light. EOM are normal.  Neck: Normal range of motion. Neck supple.  Cardiovascular: Normal rate and regular rhythm.  Musculoskeletal: Normal range of motion. She exhibits no edema.  Neurological: She is alert and oriented to person, place, and time.  Skin: Skin is warm and dry.  Psychiatric: She has a normal mood and affect. Her behavior is normal. Judgment and thought content normal.  Vitals reviewed.   Results: None   Assessment & Plan:  Sue Evans is a 72 y.o. female with a recently infected sebaceous cyst on the left posterior head. This is resolved. Some of the itching is likely from the peroxide/  alcohol she is applying to the skin and she has tried hydrocortisone and selsun blue without improvement. The antibiotics have cleared the infection and the area is no longer swollen. She wants to get this removed, but has to take care of her husband in the upcoming weeks.    -Plan for excision of the left posterior head cyst in the upcoming weeks/ months following her husband's surgery -May have another flare and will need antibiotics -Recommended stopping the peroxide/ alcohol and doing some Vaseline or coco butter to the skin in that region to see if that helps with the itching  -Patient will call when ready for excision   All questions were answered to the satisfaction of the patient.  The risk and benefits of excision of the cyst were discussed including but not limited to bleeding, infection, risk of recurrence, risk of being other etiology.  After careful consideration, Sue Evans has decided to proceed.    Lindsay C Bridges 09/30/2017, 1:21 PM       

## 2017-10-05 NOTE — Patient Instructions (Signed)
Sue Evans  10/05/2017     @   Your procedure is scheduled on 10/14/2017.  Report to [facility] at 7:15 A.M.  Call this number if you have problems the morning of surgery:  [phone]   Remember:  Do not eat food or drink liquids after midnight.  Take these medicines the morning of surgery with A SIP OF WATER Lisinopril   Do not wear jewelry, make-up or nail polish.  Do not wear lotions, powders, or perfumes, or deodorant.  Do not shave 48 hours prior to surgery.  Men may shave face and neck.  Do not bring valuables to the hospital.  [facility] is not responsible for any belongings or valuables.  Contacts, dentures or bridgework may not be worn into surgery.  Leave your suitcase in the car.  After surgery it may be brought to your room.  For patients admitted to the hospital, discharge time will be determined by your treatment team.  Patients discharged the day of surgery will not be allowed to drive home.    Please read over the following fact sheets that you were given. Surgical Site Infection Prevention and Anesthesia Post-op Instructions     PATIENT INSTRUCTIONS POST-ANESTHESIA  IMMEDIATELY FOLLOWING SURGERY:  Do not drive or operate machinery for the first twenty four hours after surgery.  Do not make any important decisions for twenty four hours after surgery or while taking narcotic pain medications or sedatives.  If you develop intractable nausea and vomiting or a severe headache please notify your doctor immediately.  FOLLOW-UP:  Please make an appointment with your surgeon as instructed. You do not need to follow up with anesthesia unless specifically instructed to do so.  WOUND CARE INSTRUCTIONS (if applicable):  Keep a dry clean dressing on the anesthesia/puncture wound site if there is drainage.  Once the wound has quit draining you may leave it open to air.  Generally you should leave the bandage intact for twenty four hours unless  there is drainage.  If the epidural site drains for more than 36-48 hours please call the anesthesia department.  QUESTIONS?:  Please feel free to call your physician or the hospital operator if you have any questions, and they will be happy to assist you.      Excision of Skin Lesions Excision of a skin lesion refers to the removal of a section of skin by making small cuts (incisions) in the skin. This procedure may be done to remove a cancerous (malignant) or noncancerous (benign) growth on the skin. It is typically done to treat or prevent cancer or infection. It may also be done to improve cosmetic appearance. The procedure may be done to remove:  Cancerous growths, such as basal cell carcinoma, squamous cell carcinoma, or melanoma.  Noncancerous growths, such as a cyst or lipoma.  Growths, such as moles or skin tags, which may be removed for cosmetic reasons.  Various excision or surgical techniques may be used depending on your condition, the location of the lesion, and your overall health. Tell a health care provider about:  Any allergies you have.  All medicines you are taking, including vitamins, herbs, eye drops, creams, and over-the-counter medicines.  Any problems you or family members have had with anesthetic medicines.  Any blood disorders you have.  Any surgeries you have had.  Any medical conditions you have.  Whether you are pregnant or may be pregnant. What are the risks? Generally, this is a safe procedure. However,  problems may occur, including:  Bleeding.  Infection.  Scarring.  Recurrence of the cyst, lipoma, or cancer.  Changes in skin sensation or appearance, such as discoloration or swelling.  Reaction to the anesthetics.  Allergic reaction to surgical materials or ointments.  Damage to nerves, blood vessels, muscles, or other structures.  Continued pain.  What happens before the procedure?  Ask your health care provider  about: ? Changing or stopping your regular medicines. This is especially important if you are taking diabetes medicines or blood thinners. ? Taking medicines such as aspirin and ibuprofen. These medicines can thin your blood. Do not take these medicines before your procedure if your health care provider instructs you not to.  You may be asked to take certain medicines.  You may be asked to stop smoking.  You may have an exam or testing.  Plan to have someone take you home after the procedure.  Plan to have someone help you with activities during recovery. What happens during the procedure?  To reduce your risk of infection: ? Your health care team will wash or sanitize their hands. ? Your skin will be washed with soap.  You will be given a medicine to numb the area (local anesthetic).  One of the following excision techniques will be performed.  At the end of any of these procedures, antibiotic ointment will be applied as needed. Each of the following techniques may vary among health care providers and hospitals. Complete Surgical Excision The area of skin that needs to be removed will be marked with a pen. Using a small scalpel or scissors, the surgeon will gently cut around and under the lesion until it is completely removed. The lesion will be placed in a fluid and sent to the lab for examination. If necessary, bleeding will be controlled with a device that delivers heat (electrocautery). The edges of the wound may be stitched (sutured) together, and a bandage (dressing) will be applied. This procedure may be performed to treat a cancerous growth or a noncancerous cyst or lesion. Excision of a Cyst The surgeon will make an incision on the cyst. The entire cyst will be removed through the incision. The incision may be closed with sutures. Shave Excision During shave excision, the surgeon will use a small blade or an electrically heated loop instrument to shave off the lesion. This may  be done to remove a mole or a skin tag. The wound will usually be left to heal on its own without sutures. Punch Excision During punch excision, the surgeon will use a small tool that is like a cookie cutter or a hole punch to cut a circle shape out of the skin. The outer edges of the skin will be sutured together. This may be done to remove a mole or a scar or to perform a biopsy of the lesion. Mohs Micrographic Surgery During Mohs micrographic surgery, layers of the lesion will be removed with a scalpel or a loop instrument and will be examined right away under a microscope. Layers will be removed until all of the abnormal or cancerous tissue has been removed. This procedure is minimally invasive, and it ensures the best cosmetic outcome. It involves the removal of as little normal tissue as possible. Mohs is usually done to treat skin cancer, such as basal cell carcinoma or squamous cell carcinoma, particularly on the face and ears. Depending on the size of the surgical wound, it may be sutured closed. What happens after the procedure?  Return  to your normal activities as told by your health care provider.  Talk with your health care provider to discuss any test results, treatment options, and if necessary, the need for more tests. This information is not intended to replace advice given to you by your health care provider. Make sure you discuss any questions you have with your health care provider. Document Released: 08/04/2009 Document Revised: 10/16/2015 Document Reviewed: 06/26/2014 Elsevier Interactive Patient Education  [employer].

## 2017-10-07 ENCOUNTER — Encounter (HOSPITAL_COMMUNITY)
Admission: RE | Admit: 2017-10-07 | Discharge: 2017-10-07 | Disposition: A | Discharge status: Home / Self Care | Payer: Medicare PPO | Source: Ambulatory Visit | Attending: General Surgery | Admitting: General Surgery

## 2017-10-07 ENCOUNTER — Other Ambulatory Visit: Payer: Self-pay

## 2017-10-07 ENCOUNTER — Encounter (HOSPITAL_COMMUNITY): Payer: Self-pay

## 2017-10-07 DIAGNOSIS — Z01818 Encounter for other preprocedural examination: Secondary | ICD-10-CM | POA: Diagnosis not present

## 2017-10-07 DIAGNOSIS — I10 Essential (primary) hypertension: Secondary | ICD-10-CM | POA: Insufficient documentation

## 2017-10-07 DIAGNOSIS — R9431 Abnormal electrocardiogram [ECG] [EKG]: Secondary | ICD-10-CM | POA: Insufficient documentation

## 2017-10-07 DIAGNOSIS — Z01812 Encounter for preprocedural laboratory examination: Secondary | ICD-10-CM | POA: Diagnosis not present

## 2017-10-07 LAB — BASIC METABOLIC PANEL
ANION GAP: 7 (ref 5–15)
BUN: 14 mg/dL (ref 6–20)
CALCIUM: 9 mg/dL (ref 8.9–10.3)
CO2: 28 mmol/L (ref 22–32)
Chloride: 105 mmol/L (ref 101–111)
Creatinine, Ser: 0.68 mg/dL (ref 0.44–1.00)
GFR calc non Af Amer: >60 mL/min (ref >60)
Glucose, Bld: 116 mg/dL — ABNORMAL HIGH (ref 65–99)
Potassium: 3.4 mmol/L — ABNORMAL LOW (ref 3.5–5.1)
SODIUM: 140 mmol/L (ref 135–145)

## 2017-10-07 LAB — CBC
HEMATOCRIT: 39.7 % (ref 36.0–46.0)
Hemoglobin: 12.9 g/dL (ref 12.0–15.0)
MCH: 28.8 pg (ref 26.0–34.0)
MCHC: 32.5 g/dL (ref 30.0–36.0)
MCV: 88.6 fL (ref 78.0–100.0)
PLATELETS: 264 10*3/uL (ref 150–400)
RBC: 4.48 MIL/uL (ref 3.87–5.11)
RDW: 13.2 % (ref 11.5–15.5)
WBC: 7.1 10*3/uL (ref 4.0–10.5)

## 2017-10-11 DIAGNOSIS — I10 Essential (primary) hypertension: Secondary | ICD-10-CM | POA: Diagnosis not present

## 2017-10-11 DIAGNOSIS — L309 Dermatitis, unspecified: Secondary | ICD-10-CM | POA: Diagnosis not present

## 2017-10-11 DIAGNOSIS — E663 Overweight: Secondary | ICD-10-CM | POA: Diagnosis not present

## 2017-10-11 DIAGNOSIS — Z1389 Encounter for screening for other disorder: Secondary | ICD-10-CM | POA: Diagnosis not present

## 2017-10-11 DIAGNOSIS — Z6828 Body mass index (BMI) 28.0-28.9, adult: Secondary | ICD-10-CM | POA: Diagnosis not present

## 2017-10-14 ENCOUNTER — Encounter (HOSPITAL_COMMUNITY): Payer: Self-pay | Admitting: *Deleted

## 2017-10-14 ENCOUNTER — Encounter (HOSPITAL_COMMUNITY): Payer: Self-pay | Admitting: Anesthesiology

## 2017-10-14 ENCOUNTER — Encounter (HOSPITAL_COMMUNITY)
Admission: RE | Disposition: A | Discharge status: Home / Self Care | Payer: Self-pay | Source: Ambulatory Visit | Attending: General Surgery

## 2017-10-14 ENCOUNTER — Ambulatory Visit (HOSPITAL_COMMUNITY)
Admission: RE | Admit: 2017-10-14 | Discharge: 2017-10-14 | Disposition: A | Discharge status: Home / Self Care | Payer: Medicare PPO | Source: Ambulatory Visit | Attending: General Surgery | Admitting: General Surgery

## 2017-10-14 DIAGNOSIS — E7800 Pure hypercholesterolemia, unspecified: Secondary | ICD-10-CM | POA: Diagnosis not present

## 2017-10-14 DIAGNOSIS — L723 Sebaceous cyst: Secondary | ICD-10-CM | POA: Diagnosis not present

## 2017-10-14 DIAGNOSIS — Z79899 Other long term (current) drug therapy: Secondary | ICD-10-CM | POA: Diagnosis not present

## 2017-10-14 DIAGNOSIS — I10 Essential (primary) hypertension: Secondary | ICD-10-CM | POA: Insufficient documentation

## 2017-10-14 DIAGNOSIS — Z538 Procedure and treatment not carried out for other reasons: Secondary | ICD-10-CM | POA: Insufficient documentation

## 2017-10-14 SURGERY — EXCISION MASS
Anesthesia: General

## 2017-10-14 MED ORDER — CHLORHEXIDINE GLUCONATE CLOTH 2 % EX PADS: 6.0000 | MEDICATED_PAD | Freq: Once | CUTANEOUS | Status: DC

## 2017-10-14 MED ORDER — PROPOFOL 10 MG/ML IV BOLUS
INTRAVENOUS | Status: AC
  Filled 2017-10-14: qty 40

## 2017-10-14 MED ORDER — SEVOFLURANE IN SOLN
RESPIRATORY_TRACT | Status: AC
  Filled 2017-10-14: qty 250

## 2017-10-14 MED ORDER — NYSTATIN-TRIAMCINOLONE 100000-0.1 UNIT/GM-% EX OINT: 1.0000 "application " | TOPICAL_OINTMENT | Freq: Three times a day (TID) | CUTANEOUS | 0 refills | Status: DC

## 2017-10-14 SURGICAL SUPPLY — 35 items
ADH SKN CLS APL DERMABOND .7 (GAUZE/BANDAGES/DRESSINGS)
CHLORAPREP W/TINT 10.5 ML (MISCELLANEOUS) ×4 IMPLANT
CLOTH BEACON ORANGE TIMEOUT ST (SAFETY) ×4 IMPLANT
COVER LIGHT HANDLE STERIS (MISCELLANEOUS) ×8 IMPLANT
DECANTER SPIKE VIAL GLASS SM (MISCELLANEOUS) ×4 IMPLANT
DERMABOND ADVANCED (GAUZE/BANDAGES/DRESSINGS)
DERMABOND ADVANCED .7 DNX12 (GAUZE/BANDAGES/DRESSINGS) IMPLANT
DRAPE EENT ADH APERT 31X51 STR (DRAPES) IMPLANT
ELECT NDL TIP 2.8 STRL (NEEDLE) IMPLANT
ELECT NEEDLE TIP 2.8 STRL (NEEDLE) IMPLANT
ELECT REM PT RETURN 9FT ADLT (ELECTROSURGICAL) ×3
ELECTRODE REM PT RTRN 9FT ADLT (ELECTROSURGICAL) ×2 IMPLANT
GLOVE BIO SURGEON STRL SZ 6.5 (GLOVE) ×3 IMPLANT
GLOVE BIO SURGEONS STRL SZ 6.5 (GLOVE) ×1
GLOVE BIOGEL PI IND STRL 6.5 (GLOVE) ×2 IMPLANT
GLOVE BIOGEL PI IND STRL 7.0 (GLOVE) ×2 IMPLANT
GLOVE BIOGEL PI INDICATOR 6.5 (GLOVE) ×2
GLOVE BIOGEL PI INDICATOR 7.0 (GLOVE) ×2
GOWN STRL REUS W/ TWL XL LVL3 (GOWN DISPOSABLE) ×2 IMPLANT
GOWN STRL REUS W/TWL LRG LVL3 (GOWN DISPOSABLE) ×4 IMPLANT
GOWN STRL REUS W/TWL XL LVL3 (GOWN DISPOSABLE) ×3
KIT TURNOVER KIT A (KITS) ×4 IMPLANT
MANIFOLD NEPTUNE II (INSTRUMENTS) ×4 IMPLANT
NDL HYPO 25X1 1.5 SAFETY (NEEDLE) ×1 IMPLANT
NEEDLE HYPO 25X1 1.5 SAFETY (NEEDLE) ×3 IMPLANT
NS IRRIG 1000ML POUR BTL (IV SOLUTION) ×4 IMPLANT
PACK MINOR (CUSTOM PROCEDURE TRAY) IMPLANT
PAD ARMBOARD 7.5X6 YLW CONV (MISCELLANEOUS) ×4 IMPLANT
SET BASIN LINEN APH (SET/KITS/TRAYS/PACK) ×4 IMPLANT
SUT ETHILON 3 0 FSL (SUTURE) IMPLANT
SUT MNCRL AB 4-0 PS2 18 (SUTURE) ×1 IMPLANT
SUT PROLENE 4 0 PS 2 18 (SUTURE) IMPLANT
SUT VIC AB 3-0 SH 27 (SUTURE)
SUT VIC AB 3-0 SH 27X BRD (SUTURE) IMPLANT
SYR CONTROL 10ML LL (SYRINGE) ×4 IMPLANT

## 2017-10-14 NOTE — Progress Notes (Signed)
[facility]  Patient cannot tell me where the cyst was. The area that I recall is still dry overlying the area, scaly skin. She had been putting alcohol over the area. Reports less.   Now with what looks like a yeast infection in her lower lateral pannus folds and her suprapubic fold. Some scabs on her back but these do not look like yeast.  Will prescribe a nyastatin cream to use. If not better by next Tuesday, will need to go to PCP.   Patient going to go to dermatology for the dry skin on the head overlying where the cyst was located.   Will let us know in the future if the cyst returns/ wants to get it removed.   Algis Greenhouse, MD [facility] [address] [place], [place] [address] [phone] (office)

## 2017-10-14 NOTE — Discharge Instructions (Signed)
Cream to the lower abdominal folds three times daily.  If not improved, go to Elfredia Nevins, MD to get evaluated on Tuesday.

## 2017-10-14 NOTE — Progress Notes (Signed)
Patient arrives today stating, "I don't feel the cyst on the back of my head". Patient however shows rash with itching on the back of the head and neck area, she also presents area of hives low anterior and left abdomen. Small area of irriation, itching  also noted mid lower back with small scabbed area. Dr. Henreitta Leber called to check the posterior head cyst.

## 2018-03-07 ENCOUNTER — Other Ambulatory Visit (HOSPITAL_COMMUNITY): Payer: Self-pay | Admitting: Internal Medicine

## 2018-03-07 DIAGNOSIS — Z1231 Encounter for screening mammogram for malignant neoplasm of breast: Secondary | ICD-10-CM

## 2018-03-15 ENCOUNTER — Ambulatory Visit (HOSPITAL_COMMUNITY): Payer: Medicare PPO

## 2018-03-16 ENCOUNTER — Ambulatory Visit (HOSPITAL_COMMUNITY)
Admission: RE | Admit: 2018-03-16 | Discharge: 2018-03-16 | Disposition: A | Discharge status: Home / Self Care | Payer: Medicare PPO | Source: Ambulatory Visit | Attending: Internal Medicine | Admitting: Internal Medicine

## 2018-03-16 DIAGNOSIS — Z1231 Encounter for screening mammogram for malignant neoplasm of breast: Secondary | ICD-10-CM | POA: Insufficient documentation

## 2018-06-20 DIAGNOSIS — L738 Other specified follicular disorders: Secondary | ICD-10-CM | POA: Diagnosis not present

## 2018-06-20 DIAGNOSIS — D1801 Hemangioma of skin and subcutaneous tissue: Secondary | ICD-10-CM | POA: Diagnosis not present

## 2018-06-20 DIAGNOSIS — L570 Actinic keratosis: Secondary | ICD-10-CM | POA: Diagnosis not present

## 2018-09-29 DIAGNOSIS — Z1389 Encounter for screening for other disorder: Secondary | ICD-10-CM | POA: Diagnosis not present

## 2018-09-29 DIAGNOSIS — Z0001 Encounter for general adult medical examination with abnormal findings: Secondary | ICD-10-CM | POA: Diagnosis not present

## 2018-09-29 DIAGNOSIS — I10 Essential (primary) hypertension: Secondary | ICD-10-CM | POA: Diagnosis not present

## 2018-09-29 DIAGNOSIS — E6609 Other obesity due to excess calories: Secondary | ICD-10-CM | POA: Diagnosis not present

## 2018-09-29 DIAGNOSIS — Z6830 Body mass index (BMI) 30.0-30.9, adult: Secondary | ICD-10-CM | POA: Diagnosis not present

## 2018-09-29 DIAGNOSIS — E7849 Other hyperlipidemia: Secondary | ICD-10-CM | POA: Diagnosis not present

## 2019-02-13 ENCOUNTER — Other Ambulatory Visit (HOSPITAL_COMMUNITY): Payer: Self-pay | Admitting: Internal Medicine

## 2019-02-13 DIAGNOSIS — Z1231 Encounter for screening mammogram for malignant neoplasm of breast: Secondary | ICD-10-CM

## 2019-02-14 DIAGNOSIS — Z9289 Personal history of other medical treatment: Secondary | ICD-10-CM | POA: Diagnosis not present

## 2019-02-14 DIAGNOSIS — Z1212 Encounter for screening for malignant neoplasm of rectum: Secondary | ICD-10-CM | POA: Diagnosis not present

## 2019-02-14 DIAGNOSIS — Z01419 Encounter for gynecological examination (general) (routine) without abnormal findings: Secondary | ICD-10-CM | POA: Diagnosis not present

## 2019-03-21 ENCOUNTER — Ambulatory Visit (HOSPITAL_COMMUNITY)
Admission: RE | Admit: 2019-03-21 | Discharge: 2019-03-21 | Disposition: A | Discharge status: Home / Self Care | Payer: Medicare PPO | Source: Ambulatory Visit | Attending: Internal Medicine | Admitting: Internal Medicine

## 2019-03-21 ENCOUNTER — Other Ambulatory Visit: Payer: Self-pay

## 2019-03-21 DIAGNOSIS — Z1231 Encounter for screening mammogram for malignant neoplasm of breast: Secondary | ICD-10-CM | POA: Diagnosis not present

## 2019-07-17 DIAGNOSIS — D239 Other benign neoplasm of skin, unspecified: Secondary | ICD-10-CM | POA: Diagnosis not present

## 2019-07-17 DIAGNOSIS — D1801 Hemangioma of skin and subcutaneous tissue: Secondary | ICD-10-CM | POA: Diagnosis not present

## 2019-07-17 DIAGNOSIS — L821 Other seborrheic keratosis: Secondary | ICD-10-CM | POA: Diagnosis not present

## 2019-07-17 DIAGNOSIS — L570 Actinic keratosis: Secondary | ICD-10-CM | POA: Diagnosis not present

## 2019-10-01 DIAGNOSIS — L309 Dermatitis, unspecified: Secondary | ICD-10-CM | POA: Diagnosis not present

## 2019-10-01 DIAGNOSIS — Z6829 Body mass index (BMI) 29.0-29.9, adult: Secondary | ICD-10-CM | POA: Diagnosis not present

## 2019-10-01 DIAGNOSIS — E7849 Other hyperlipidemia: Secondary | ICD-10-CM | POA: Diagnosis not present

## 2019-10-01 DIAGNOSIS — M1991 Primary osteoarthritis, unspecified site: Secondary | ICD-10-CM | POA: Diagnosis not present

## 2019-10-01 DIAGNOSIS — I10 Essential (primary) hypertension: Secondary | ICD-10-CM | POA: Diagnosis not present

## 2019-10-01 DIAGNOSIS — E663 Overweight: Secondary | ICD-10-CM | POA: Diagnosis not present

## 2019-10-01 DIAGNOSIS — Z0000 Encounter for general adult medical examination without abnormal findings: Secondary | ICD-10-CM | POA: Diagnosis not present

## 2019-10-01 DIAGNOSIS — Z1389 Encounter for screening for other disorder: Secondary | ICD-10-CM | POA: Diagnosis not present

## 2020-02-18 DIAGNOSIS — Z01419 Encounter for gynecological examination (general) (routine) without abnormal findings: Secondary | ICD-10-CM | POA: Diagnosis not present

## 2020-02-18 DIAGNOSIS — Z1212 Encounter for screening for malignant neoplasm of rectum: Secondary | ICD-10-CM | POA: Diagnosis not present

## 2020-03-18 ENCOUNTER — Other Ambulatory Visit (HOSPITAL_COMMUNITY): Payer: Self-pay | Admitting: Internal Medicine

## 2020-03-18 DIAGNOSIS — Z1231 Encounter for screening mammogram for malignant neoplasm of breast: Secondary | ICD-10-CM

## 2020-04-21 ENCOUNTER — Ambulatory Visit (HOSPITAL_COMMUNITY): Payer: Medicare PPO

## 2020-05-08 ENCOUNTER — Ambulatory Visit (HOSPITAL_COMMUNITY)
Admission: RE | Admit: 2020-05-08 | Discharge: 2020-05-08 | Disposition: A | Discharge status: Home / Self Care | Payer: Medicare PPO | Source: Ambulatory Visit | Attending: Internal Medicine | Admitting: Internal Medicine

## 2020-05-08 ENCOUNTER — Other Ambulatory Visit: Payer: Self-pay

## 2020-05-08 DIAGNOSIS — Z1231 Encounter for screening mammogram for malignant neoplasm of breast: Secondary | ICD-10-CM | POA: Insufficient documentation

## 2020-07-16 DIAGNOSIS — L309 Dermatitis, unspecified: Secondary | ICD-10-CM | POA: Diagnosis not present

## 2020-07-16 DIAGNOSIS — L570 Actinic keratosis: Secondary | ICD-10-CM | POA: Diagnosis not present

## 2020-07-16 DIAGNOSIS — D239 Other benign neoplasm of skin, unspecified: Secondary | ICD-10-CM | POA: Diagnosis not present

## 2020-10-29 DIAGNOSIS — I10 Essential (primary) hypertension: Secondary | ICD-10-CM | POA: Diagnosis not present

## 2020-10-29 DIAGNOSIS — E7849 Other hyperlipidemia: Secondary | ICD-10-CM | POA: Diagnosis not present

## 2020-10-29 DIAGNOSIS — Z1331 Encounter for screening for depression: Secondary | ICD-10-CM | POA: Diagnosis not present

## 2020-10-29 DIAGNOSIS — Z0001 Encounter for general adult medical examination with abnormal findings: Secondary | ICD-10-CM | POA: Diagnosis not present

## 2020-10-29 DIAGNOSIS — M1991 Primary osteoarthritis, unspecified site: Secondary | ICD-10-CM | POA: Diagnosis not present

## 2020-10-29 DIAGNOSIS — Z6825 Body mass index (BMI) 25.0-25.9, adult: Secondary | ICD-10-CM | POA: Diagnosis not present

## 2020-10-29 DIAGNOSIS — Z1389 Encounter for screening for other disorder: Secondary | ICD-10-CM | POA: Diagnosis not present

## 2020-10-29 DIAGNOSIS — E663 Overweight: Secondary | ICD-10-CM | POA: Diagnosis not present

## 2021-02-23 DIAGNOSIS — Z01419 Encounter for gynecological examination (general) (routine) without abnormal findings: Secondary | ICD-10-CM | POA: Diagnosis not present

## 2021-02-23 DIAGNOSIS — Z124 Encounter for screening for malignant neoplasm of cervix: Secondary | ICD-10-CM | POA: Diagnosis not present

## 2021-02-23 DIAGNOSIS — Z1212 Encounter for screening for malignant neoplasm of rectum: Secondary | ICD-10-CM | POA: Diagnosis not present

## 2021-05-25 DIAGNOSIS — J019 Acute sinusitis, unspecified: Secondary | ICD-10-CM | POA: Diagnosis not present

## 2021-05-25 DIAGNOSIS — I10 Essential (primary) hypertension: Secondary | ICD-10-CM | POA: Diagnosis not present

## 2021-05-26 ENCOUNTER — Other Ambulatory Visit (HOSPITAL_COMMUNITY): Payer: Self-pay | Admitting: Internal Medicine

## 2021-05-26 DIAGNOSIS — Z1231 Encounter for screening mammogram for malignant neoplasm of breast: Secondary | ICD-10-CM

## 2021-06-03 ENCOUNTER — Ambulatory Visit (HOSPITAL_COMMUNITY)
Admission: RE | Admit: 2021-06-03 | Discharge: 2021-06-03 | Disposition: A | Discharge status: Home / Self Care | Payer: Medicare PPO | Source: Ambulatory Visit | Attending: Internal Medicine | Admitting: Internal Medicine

## 2021-06-03 ENCOUNTER — Other Ambulatory Visit: Payer: Self-pay

## 2021-06-03 DIAGNOSIS — Z1231 Encounter for screening mammogram for malignant neoplasm of breast: Secondary | ICD-10-CM | POA: Insufficient documentation

## 2021-07-15 DIAGNOSIS — D239 Other benign neoplasm of skin, unspecified: Secondary | ICD-10-CM | POA: Diagnosis not present

## 2021-07-15 DIAGNOSIS — Z1283 Encounter for screening for malignant neoplasm of skin: Secondary | ICD-10-CM | POA: Diagnosis not present

## 2021-07-15 DIAGNOSIS — L570 Actinic keratosis: Secondary | ICD-10-CM | POA: Diagnosis not present

## 2022-01-08 DIAGNOSIS — H524 Presbyopia: Secondary | ICD-10-CM | POA: Diagnosis not present

## 2022-01-08 DIAGNOSIS — H40053 Ocular hypertension, bilateral: Secondary | ICD-10-CM | POA: Diagnosis not present

## 2022-01-29 IMAGING — MG MM DIGITAL SCREENING BILAT W/ TOMO AND CAD
6 of 10 series · 6 of 30 positions shown · non-contrast
Comparison: Previous exam(s).

CLINICAL DATA: Screening.

EXAM:
DIGITAL SCREENING BILATERAL MAMMOGRAM WITH TOMOSYNTHESIS AND CAD
TECHNIQUE: Bilateral screening digital craniocaudal and mediolateral oblique
mammograms were obtained. Bilateral screening digital breast
tomosynthesis was performed. The images were evaluated with
computer-aided detection.

[L CC synth-2D]
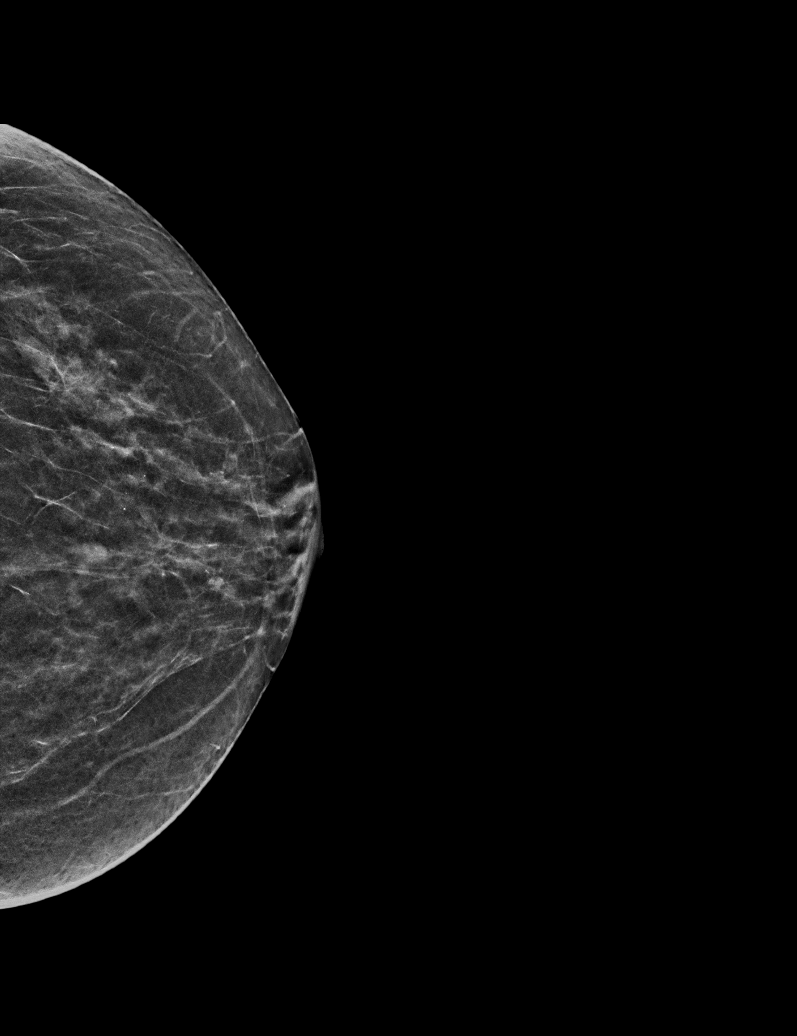

[L MLO synth-2D (1 of 2)]
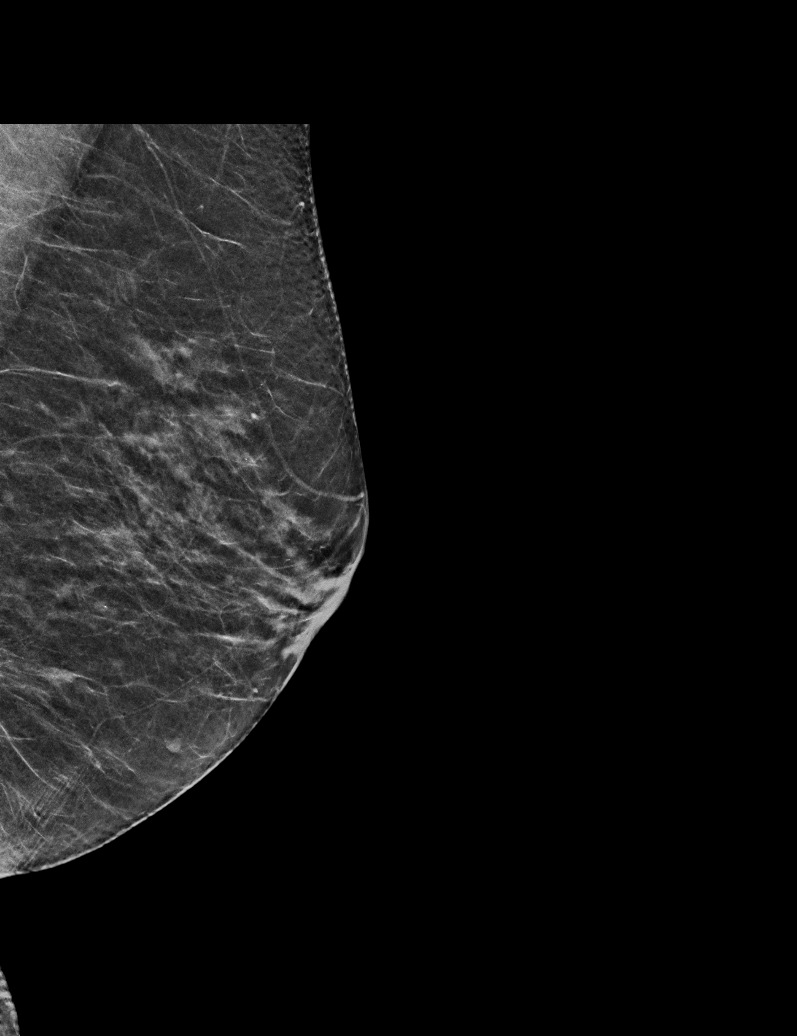

[R CC synth-2D]
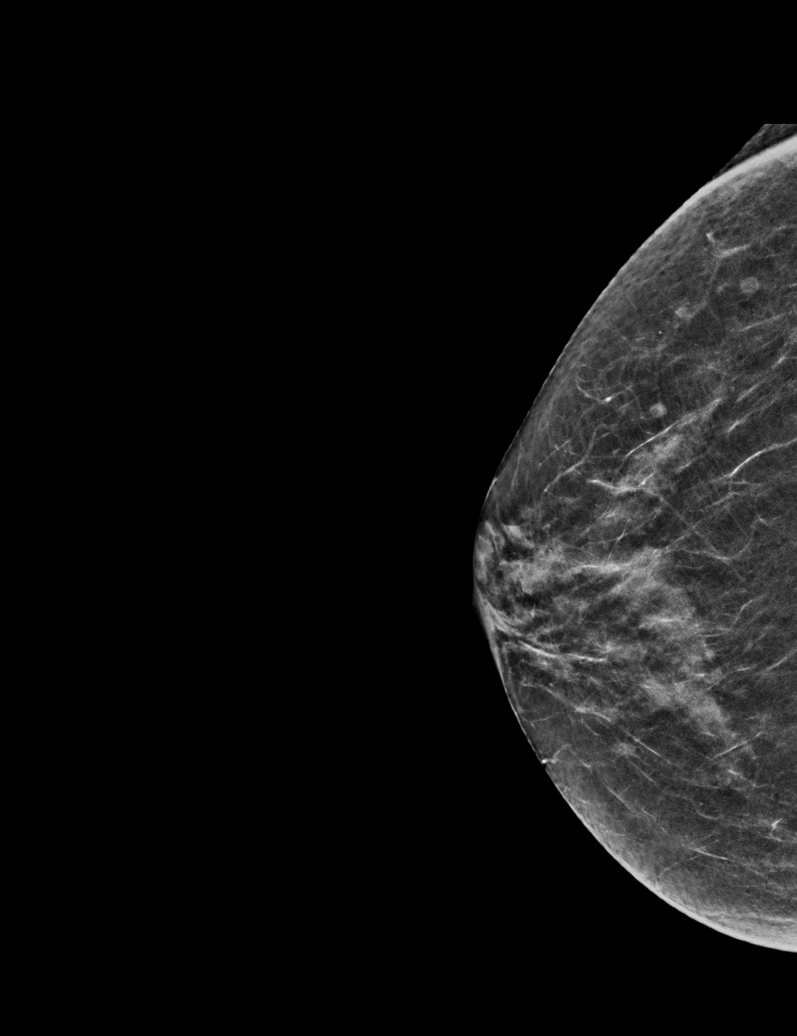

[L MLO synth-2D (2 of 2)]
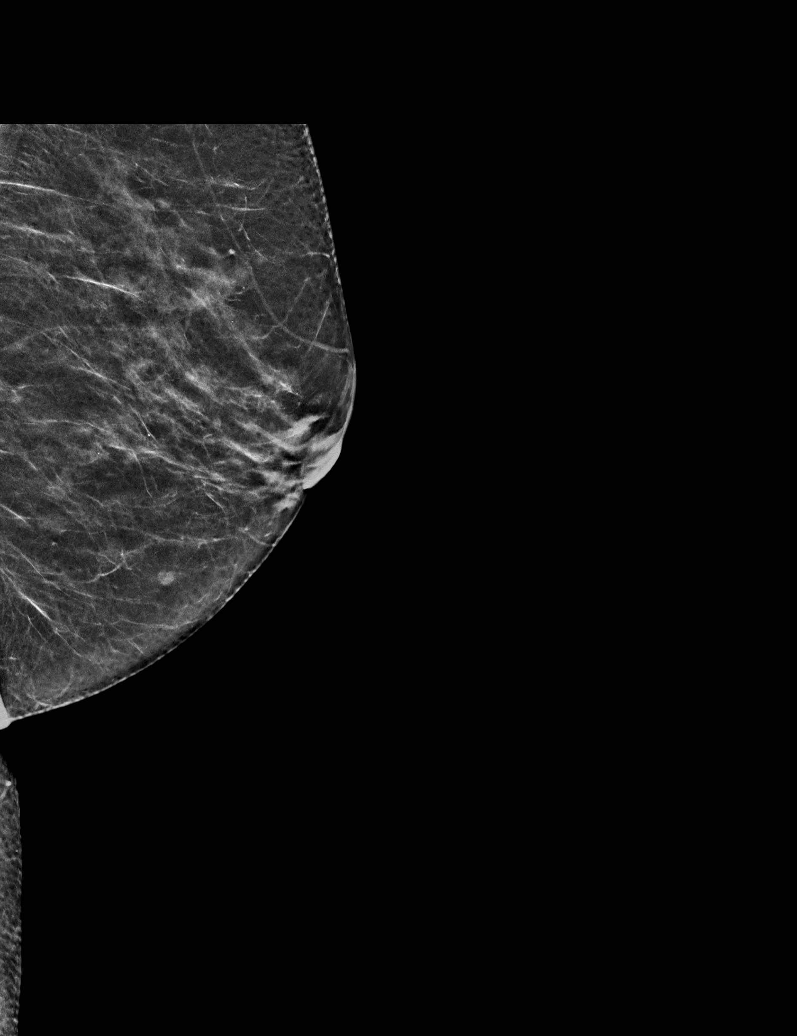

[R MLO synth-2D]
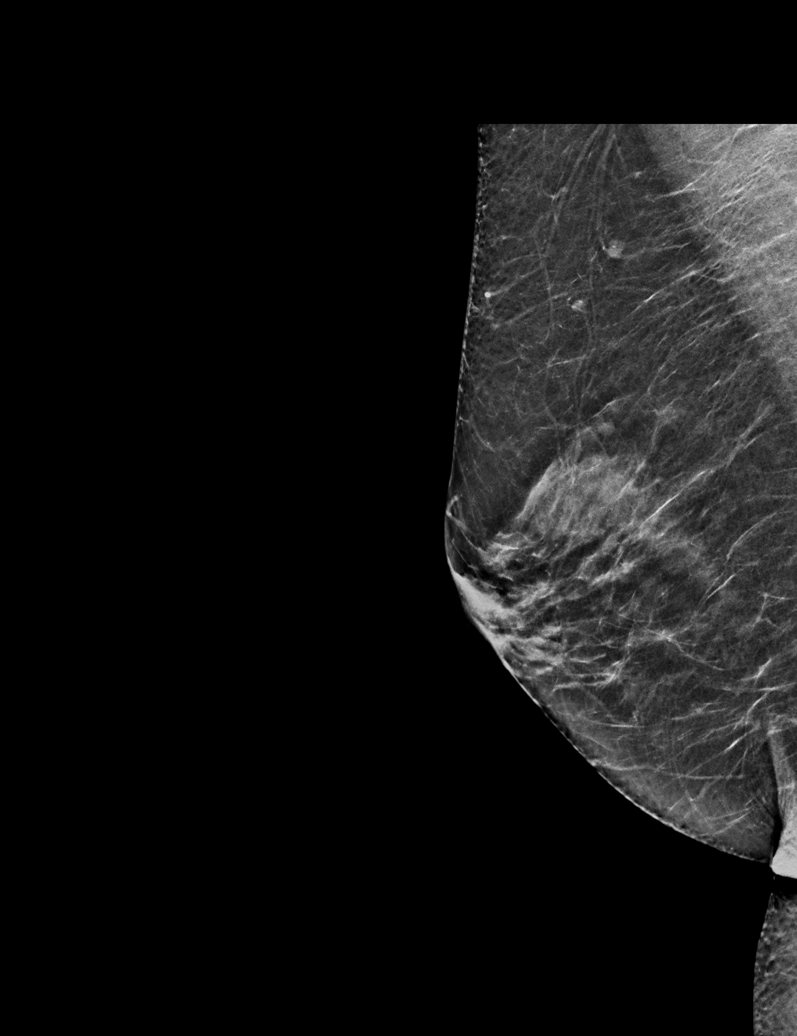

[L MLO tomo · tomo slice 25/49.0]
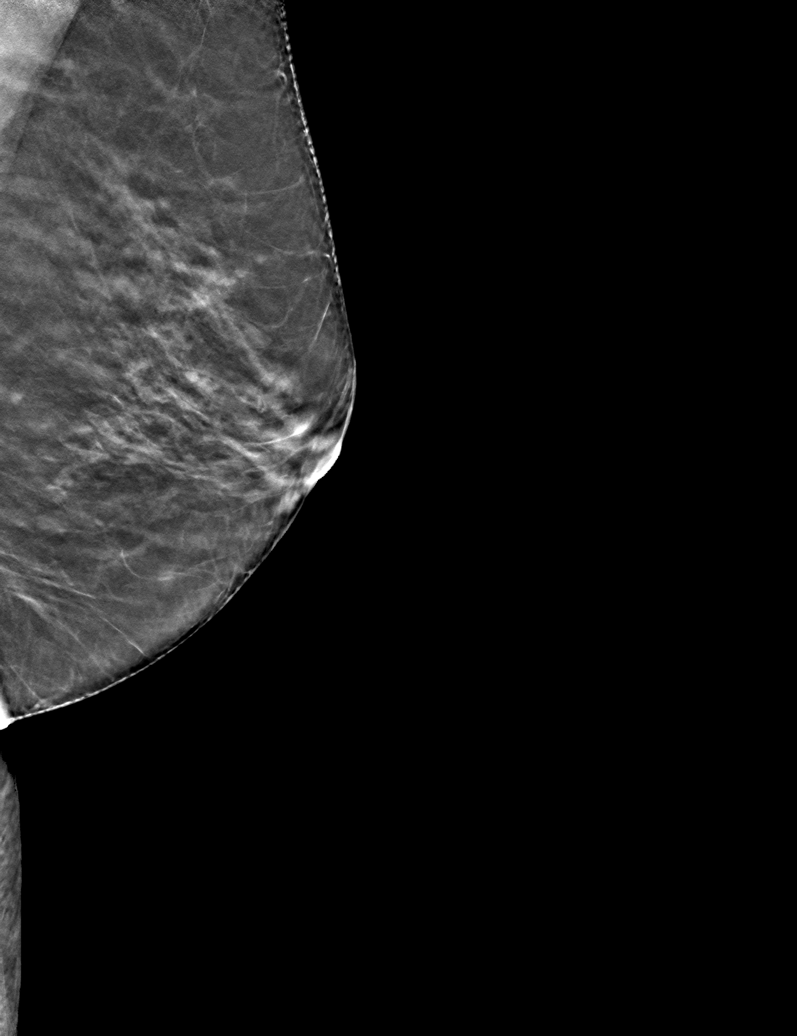

[6 of 30 positions shown; findings below may reference images not displayed]

ACR Breast Density Category b: There are scattered areas of
fibroglandular density.
FINDINGS: There are no findings suspicious for malignancy.
IMPRESSION: No mammographic evidence of malignancy. A result letter of this
screening mammogram will be mailed directly to the patient.

RECOMMENDATION:
Screening mammogram in one year. (Code:51-O-LD2)

BI-RADS CATEGORY  1: Negative.

## 2022-03-16 DIAGNOSIS — S01111A Laceration without foreign body of right eyelid and periocular area, initial encounter: Secondary | ICD-10-CM | POA: Diagnosis not present

## 2022-03-22 DIAGNOSIS — M1991 Primary osteoarthritis, unspecified site: Secondary | ICD-10-CM | POA: Diagnosis not present

## 2022-03-22 DIAGNOSIS — E559 Vitamin D deficiency, unspecified: Secondary | ICD-10-CM | POA: Diagnosis not present

## 2022-03-22 DIAGNOSIS — I10 Essential (primary) hypertension: Secondary | ICD-10-CM | POA: Diagnosis not present

## 2022-03-22 DIAGNOSIS — H8110 Benign paroxysmal vertigo, unspecified ear: Secondary | ICD-10-CM | POA: Diagnosis not present

## 2022-03-22 DIAGNOSIS — Z1331 Encounter for screening for depression: Secondary | ICD-10-CM | POA: Diagnosis not present

## 2022-03-22 DIAGNOSIS — Z6823 Body mass index (BMI) 23.0-23.9, adult: Secondary | ICD-10-CM | POA: Diagnosis not present

## 2022-03-22 DIAGNOSIS — Z0001 Encounter for general adult medical examination with abnormal findings: Secondary | ICD-10-CM | POA: Diagnosis not present

## 2022-03-22 DIAGNOSIS — E039 Hypothyroidism, unspecified: Secondary | ICD-10-CM | POA: Diagnosis not present

## 2022-03-22 DIAGNOSIS — S0511XA Contusion of eyeball and orbital tissues, right eye, initial encounter: Secondary | ICD-10-CM | POA: Diagnosis not present

## 2022-03-22 DIAGNOSIS — D518 Other vitamin B12 deficiency anemias: Secondary | ICD-10-CM | POA: Diagnosis not present

## 2022-04-22 DIAGNOSIS — H8110 Benign paroxysmal vertigo, unspecified ear: Secondary | ICD-10-CM | POA: Diagnosis not present

## 2022-04-22 DIAGNOSIS — I10 Essential (primary) hypertension: Secondary | ICD-10-CM | POA: Diagnosis not present

## 2022-05-12 DIAGNOSIS — Z01419 Encounter for gynecological examination (general) (routine) without abnormal findings: Secondary | ICD-10-CM | POA: Diagnosis not present

## 2022-05-12 DIAGNOSIS — Z780 Asymptomatic menopausal state: Secondary | ICD-10-CM | POA: Diagnosis not present

## 2022-05-12 DIAGNOSIS — Z1212 Encounter for screening for malignant neoplasm of rectum: Secondary | ICD-10-CM | POA: Diagnosis not present

## 2022-06-04 ENCOUNTER — Other Ambulatory Visit (HOSPITAL_COMMUNITY): Payer: Self-pay | Admitting: Internal Medicine

## 2022-06-04 DIAGNOSIS — Z1231 Encounter for screening mammogram for malignant neoplasm of breast: Secondary | ICD-10-CM

## 2022-06-09 ENCOUNTER — Ambulatory Visit (HOSPITAL_COMMUNITY)
Admission: RE | Admit: 2022-06-09 | Discharge: 2022-06-09 | Disposition: A | Discharge status: Home / Self Care | Payer: Medicare PPO | Source: Ambulatory Visit | Attending: Internal Medicine | Admitting: Internal Medicine

## 2022-06-09 DIAGNOSIS — Z1231 Encounter for screening mammogram for malignant neoplasm of breast: Secondary | ICD-10-CM | POA: Diagnosis not present

## 2022-06-14 ENCOUNTER — Other Ambulatory Visit (HOSPITAL_COMMUNITY): Payer: Self-pay | Admitting: Internal Medicine

## 2022-06-14 DIAGNOSIS — R928 Other abnormal and inconclusive findings on diagnostic imaging of breast: Secondary | ICD-10-CM

## 2022-07-06 ENCOUNTER — Encounter (HOSPITAL_COMMUNITY): Payer: Self-pay

## 2022-07-06 ENCOUNTER — Ambulatory Visit (HOSPITAL_COMMUNITY)
Admission: RE | Admit: 2022-07-06 | Discharge: 2022-07-06 | Disposition: A | Discharge status: Home / Self Care | Payer: Medicare PPO | Source: Ambulatory Visit | Attending: Internal Medicine | Admitting: Internal Medicine

## 2022-07-06 DIAGNOSIS — R928 Other abnormal and inconclusive findings on diagnostic imaging of breast: Secondary | ICD-10-CM

## 2022-07-06 DIAGNOSIS — N6002 Solitary cyst of left breast: Secondary | ICD-10-CM | POA: Diagnosis not present

## 2022-07-14 DIAGNOSIS — D485 Neoplasm of uncertain behavior of skin: Secondary | ICD-10-CM | POA: Diagnosis not present

## 2022-07-14 DIAGNOSIS — L570 Actinic keratosis: Secondary | ICD-10-CM | POA: Diagnosis not present

## 2022-10-26 DIAGNOSIS — L570 Actinic keratosis: Secondary | ICD-10-CM | POA: Diagnosis not present

## 2023-01-11 DIAGNOSIS — H40013 Open angle with borderline findings, low risk, bilateral: Secondary | ICD-10-CM | POA: Diagnosis not present

## 2023-01-11 DIAGNOSIS — H524 Presbyopia: Secondary | ICD-10-CM | POA: Diagnosis not present

## 2023-01-30 DIAGNOSIS — L308 Other specified dermatitis: Secondary | ICD-10-CM | POA: Diagnosis not present

## 2023-02-09 ENCOUNTER — Encounter: Payer: Self-pay | Admitting: *Deleted

## 2023-02-28 ENCOUNTER — Telehealth: Payer: Self-pay | Admitting: *Deleted

## 2023-02-28 ENCOUNTER — Ambulatory Visit (INDEPENDENT_AMBULATORY_CARE_PROVIDER_SITE_OTHER): Payer: Medicare PPO | Admitting: Gastroenterology

## 2023-02-28 ENCOUNTER — Encounter: Payer: Self-pay | Admitting: Gastroenterology

## 2023-02-28 ENCOUNTER — Other Ambulatory Visit: Payer: Self-pay | Admitting: *Deleted

## 2023-02-28 ENCOUNTER — Encounter: Payer: Self-pay | Admitting: *Deleted

## 2023-02-28 VITALS — BP 137/83 | HR 57 | Temp 98.3°F | Ht 64.0 in | Wt 141.2 lb

## 2023-02-28 DIAGNOSIS — Z1211 Encounter for screening for malignant neoplasm of colon: Secondary | ICD-10-CM | POA: Diagnosis not present

## 2023-02-28 MED ORDER — PEG 3350-KCL-NA BICARB-NACL 420 G PO SOLR: 4000.0000 mL | Freq: Once | ORAL | 0 refills | Status: AC

## 2023-02-28 NOTE — Progress Notes (Signed)
GI Office Note    Referring Provider: Elfredia Nevins, MD Primary Care Physician:  Elfredia Nevins, MD  Primary Gastroenterologist: Roetta Sessions, MD   Chief Complaint   Chief Complaint  Patient presents with   Colonoscopy    OV due to age, last one was on 03/05/2013     History of Present Illness   Sue Evans is a 77 y.o. female presenting today for screening colonoscopy, scheduled to come in for ov due to age.   She states she is in good health, she feels 52 until she looks in the mirror. She denies constipation, diarrhea, melena, brbpr. No abdominal pain. No GERD, dysphagia. She lost her husband in 2023. She is concerned about finding a driver for her colonoscopy but has a couple of people in mind. She is interested in pursuing this one last colonoscopy.  Colonoscopy 02/2013: -colonic diverticulosis -screening colonoscopy in 10 years  Medications   Current Outpatient Medications  Medication Sig Dispense Refill   calcium carbonate (OS-CAL) 1250 MG chewable tablet Chew 1 tablet by mouth daily.     lisinopril (PRINIVIL,ZESTRIL) 10 MG tablet Take 10 mg by mouth daily as needed (HIGH BLOOD PRESSURE).      rosuvastatin (CRESTOR) 10 MG tablet Take by mouth.     triamcinolone ointment (KENALOG) 0.1 % SMARTSIG:Sparingly Topical Twice Daily PRN     No current facility-administered medications for this visit.    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     TRIGGER FINGER RELEASE      TUBAL LIGATION      Past Family History   Family History  Problem Relation Age of Onset   Heart disease Mother    Stroke Father    Atrial fibrillation Father    Heart disease Sister    Stroke Sister     Past Social History   Social History   Socioeconomic History   Marital status: Married    Spouse name: Not on file   Number of children: Not on file   Years of education: Not on file   Highest education level: Not on file  Occupational History   Not on file  Tobacco Use   Smoking status: Never   Smokeless tobacco: Never  Substance and Sexual Activity   Alcohol use: No   Drug use: No   Sexual activity: Not Currently  Other Topics Concern   Not on file  Social History Narrative   Not on file   Social Determinants of Health   Financial Resource Strain: Not on file  Food Insecurity: Not on file  Transportation Needs: Not on file  Physical Activity: Not on file  Stress: Not on file  Social Connections: Not on file  Intimate Partner Violence: Not on file    Review of Systems   General: Negative for anorexia, weight loss, fever, chills, fatigue, weakness. Eyes: Negative for vision changes.  ENT: Negative for hoarseness, difficulty swallowing ,  nasal congestion. CV: Negative for chest pain, angina, palpitations, dyspnea on exertion, peripheral edema.  Respiratory: Negative for dyspnea at rest, dyspnea on exertion, cough, sputum, wheezing.  GI: See history of present illness. GU:  Negative for dysuria, hematuria, urinary incontinence, urinary frequency, nocturnal urination.  MS: Negative for joint pain, low back pain.  Derm: Negative for rash or itching.  Neuro: Negative for weakness, abnormal sensation, seizure, frequent headaches, memory loss,  confusion.  Psych: Negative for anxiety, depression, suicidal ideation, hallucinations.  Endo: Negative for unusual weight change.  Heme: Negative for bruising or bleeding. Allergy: Negative for rash or  hives.  Physical Exam   BP 137/83 (BP Location: Right Arm, Patient Position: Sitting, Cuff Size: Normal)   Pulse (!) 57   Temp 98.3 F (36.8 C) (Oral)   Ht 5\' 4"  (1.626 m)   Wt 141 lb 3.2 oz (64 kg)   SpO2 97%   BMI 24.24 kg/m    General: Well-nourished, well-developed in no acute distress.  Head: Normocephalic, atraumatic.   Eyes: Conjunctiva pink, no icterus. Mouth: Oropharyngeal mucosa moist and pink   Neck: Supple without thyromegaly, masses, or lymphadenopathy.  Lungs: Clear to auscultation bilaterally.  Heart: Regular rate and rhythm, no murmurs rubs or gallops.  Abdomen: Bowel sounds are normal, nontender, nondistended, no hepatosplenomegaly or masses,  no abdominal bruits or hernia, no rebound or guarding.   Rectal: not performed Extremities: No lower extremity edema. No clubbing or deformities.  Neuro: Alert and oriented x 4 , grossly normal neurologically.  Skin: Warm and dry, no rash or jaundice.   Psych: Alert and cooperative, normal mood and affect.  Labs  None available Imaging Studies   No results found.  Assessment/Plan:   *Encounter for screening colonoscopy -last colonoscopy 10 years ago -she is in good health, this will likely be her last colonoscopy due to age -ASA2 - I have discussed the risks, alternatives, benefits with regards to but not limited to the risk of reaction to medication, bleeding, infection, perforation and the patient is agreeable to proceed. Written consent to be obtained.     [name]. Melvyn Neth, MHS, PA-C [facility]

## 2023-02-28 NOTE — H&P (View-Only) (Signed)
GI Office Note    Referring Provider: Elfredia Nevins, MD Primary Care Physician:  Elfredia Nevins, MD  Primary Gastroenterologist: Roetta Sessions, MD   Chief Complaint   Chief Complaint  Patient presents with   Colonoscopy    OV due to age, last one was on 03/05/2013     History of Present Illness   Sue Evans is a 77 y.o. female presenting today for screening colonoscopy, scheduled to come in for ov due to age.   She states she is in good health, she feels 52 until she looks in the mirror. She denies constipation, diarrhea, melena, brbpr. No abdominal pain. No GERD, dysphagia. She lost her husband in 2023. She is concerned about finding a driver for her colonoscopy but has a couple of people in mind. She is interested in pursuing this one last colonoscopy.  Colonoscopy 02/2013: -colonic diverticulosis -screening colonoscopy in 10 years  Medications   Current Outpatient Medications  Medication Sig Dispense Refill   calcium carbonate (OS-CAL) 1250 MG chewable tablet Chew 1 tablet by mouth daily.     lisinopril (PRINIVIL,ZESTRIL) 10 MG tablet Take 10 mg by mouth daily as needed (HIGH BLOOD PRESSURE).      rosuvastatin (CRESTOR) 10 MG tablet Take by mouth.     triamcinolone ointment (KENALOG) 0.1 % SMARTSIG:Sparingly Topical Twice Daily PRN     No current facility-administered medications for this visit.    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     TRIGGER FINGER RELEASE      TUBAL LIGATION      Past Family History   Family History  Problem Relation Age of Onset   Heart disease Mother    Stroke Father    Atrial fibrillation Father    Heart disease Sister    Stroke Sister     Past Social History   Social History   Socioeconomic History   Marital status: Married    Spouse name: Not on file   Number of children: Not on file   Years of education: Not on file   Highest education level: Not on file  Occupational History   Not on file  Tobacco Use   Smoking status: Never   Smokeless tobacco: Never  Substance and Sexual Activity   Alcohol use: No   Drug use: No   Sexual activity: Not Currently  Other Topics Concern   Not on file  Social History Narrative   Not on file   Social Determinants of Health   Financial Resource Strain: Not on file  Food Insecurity: Not on file  Transportation Needs: Not on file  Physical Activity: Not on file  Stress: Not on file  Social Connections: Not on file  Intimate Partner Violence: Not on file    Review of Systems   General: Negative for anorexia, weight loss, fever, chills, fatigue, weakness. Eyes: Negative for vision changes.  ENT: Negative for hoarseness, difficulty swallowing ,  nasal congestion. CV: Negative for chest pain, angina, palpitations, dyspnea on exertion, peripheral edema.  Respiratory: Negative for dyspnea at rest, dyspnea on exertion, cough, sputum, wheezing.  GI: See history of present illness. GU:  Negative for dysuria, hematuria, urinary incontinence, urinary frequency, nocturnal urination.  MS: Negative for joint pain, low back pain.  Derm: Negative for rash or itching.  Neuro: Negative for weakness, abnormal sensation, seizure, frequent headaches, memory loss,  confusion.  Psych: Negative for anxiety, depression, suicidal ideation, hallucinations.  Endo: Negative for unusual weight change.  Heme: Negative for bruising or bleeding. Allergy: Negative for rash or  hives.  Physical Exam   BP 137/83 (BP Location: Right Arm, Patient Position: Sitting, Cuff Size: Normal)   Pulse (!) 57   Temp 98.3 F (36.8 C) (Oral)   Ht 5\' 4"  (1.626 m)   Wt 141 lb 3.2 oz (64 kg)   SpO2 97%   BMI 24.24 kg/m    General: Well-nourished, well-developed in no acute distress.  Head: Normocephalic, atraumatic.   Eyes: Conjunctiva pink, no icterus. Mouth: Oropharyngeal mucosa moist and pink   Neck: Supple without thyromegaly, masses, or lymphadenopathy.  Lungs: Clear to auscultation bilaterally.  Heart: Regular rate and rhythm, no murmurs rubs or gallops.  Abdomen: Bowel sounds are normal, nontender, nondistended, no hepatosplenomegaly or masses,  no abdominal bruits or hernia, no rebound or guarding.   Rectal: not performed Extremities: No lower extremity edema. No clubbing or deformities.  Neuro: Alert and oriented x 4 , grossly normal neurologically.  Skin: Warm and dry, no rash or jaundice.   Psych: Alert and cooperative, normal mood and affect.  Labs  None available Imaging Studies   No results found.  Assessment/Plan:   *Encounter for screening colonoscopy -last colonoscopy 10 years ago -she is in good health, this will likely be her last colonoscopy due to age -ASA2 - I have discussed the risks, alternatives, benefits with regards to but not limited to the risk of reaction to medication, bleeding, infection, perforation and the patient is agreeable to proceed. Written consent to be obtained.     Sue Evans. Melvyn Neth, MHS, PA-C [facility]

## 2023-02-28 NOTE — Telephone Encounter (Signed)
PA approved via cohere. Authorization #[id number] DOS: 03/14/2023 - 05/14/2023

## 2023-02-28 NOTE — Patient Instructions (Signed)
Colonoscopy to be scheduled. See separate instructions.  ?

## 2023-03-14 ENCOUNTER — Other Ambulatory Visit: Payer: Self-pay

## 2023-03-14 ENCOUNTER — Ambulatory Visit (HOSPITAL_COMMUNITY): Payer: Medicare PPO | Admitting: Anesthesiology

## 2023-03-14 ENCOUNTER — Encounter (HOSPITAL_COMMUNITY): Payer: Self-pay | Admitting: Internal Medicine

## 2023-03-14 ENCOUNTER — Encounter (HOSPITAL_COMMUNITY)
Admission: RE | Disposition: A | Discharge status: Home / Self Care | Payer: Self-pay | Source: Home / Self Care | Attending: Internal Medicine

## 2023-03-14 ENCOUNTER — Ambulatory Visit (HOSPITAL_COMMUNITY)
Admission: RE | Admit: 2023-03-14 | Discharge: 2023-03-14 | Disposition: A | Discharge status: Home / Self Care | Payer: Medicare PPO | Attending: Internal Medicine | Admitting: Internal Medicine

## 2023-03-14 DIAGNOSIS — K5730 Diverticulosis of large intestine without perforation or abscess without bleeding: Secondary | ICD-10-CM | POA: Diagnosis not present

## 2023-03-14 DIAGNOSIS — Z1211 Encounter for screening for malignant neoplasm of colon: Secondary | ICD-10-CM | POA: Diagnosis not present

## 2023-03-14 DIAGNOSIS — D126 Benign neoplasm of colon, unspecified: Secondary | ICD-10-CM | POA: Diagnosis not present

## 2023-03-14 DIAGNOSIS — K635 Polyp of colon: Secondary | ICD-10-CM | POA: Insufficient documentation

## 2023-03-14 DIAGNOSIS — D120 Benign neoplasm of cecum: Secondary | ICD-10-CM | POA: Diagnosis not present

## 2023-03-14 HISTORY — PX: COLONOSCOPY WITH PROPOFOL: SHX5780

## 2023-03-14 SURGERY — COLONOSCOPY WITH PROPOFOL
Anesthesia: General

## 2023-03-14 MED ORDER — PROPOFOL 10 MG/ML IV BOLUS
INTRAVENOUS | Status: DC | PRN
  Administered 2023-03-14: 50 mg via INTRAVENOUS

## 2023-03-14 MED ORDER — STERILE WATER FOR IRRIGATION IR SOLN
Status: DC | PRN
  Administered 2023-03-14: 120 mL

## 2023-03-14 MED ORDER — LACTATED RINGERS IV SOLN: INTRAVENOUS | Status: DC | PRN

## 2023-03-14 MED ORDER — PROPOFOL 500 MG/50ML IV EMUL
INTRAVENOUS | Status: DC | PRN
  Administered 2023-03-14: 150 ug/kg/min via INTRAVENOUS

## 2023-03-14 NOTE — Anesthesia Postprocedure Evaluation (Signed)
Anesthesia Post Note  Patient: Sue Evans  Procedure(s) Performed: COLONOSCOPY WITH PROPOFOL  Patient location during evaluation: PACU Anesthesia Type: General Level of consciousness: awake and alert Pain management: pain level controlled Vital Signs Assessment: post-procedure vital signs reviewed and stable Respiratory status: spontaneous breathing, nonlabored ventilation, respiratory function stable and patient connected to nasal cannula oxygen Cardiovascular status: blood pressure returned to baseline and stable Postop Assessment: no apparent nausea or vomiting Anesthetic complications: no   There were no known notable events for this encounter.   Last Vitals:  Vitals:   03/14/23 1140 03/14/23 1319  BP: (!) 142/66 (!) 99/56  Pulse: 66 70  Resp: 14 17  Temp: 36.6 C 36.5 C  SpO2: 97% 96%    Last Pain:  Vitals:   03/14/23 1319  TempSrc: Axillary  PainSc: 0-No pain                 Ezechiel Stooksbury L Jack Mineau

## 2023-03-14 NOTE — Discharge Instructions (Signed)
  Colonoscopy Discharge Instructions  Read the instructions outlined below and refer to this sheet in the next few weeks. These discharge instructions provide you with general information on caring for yourself after you leave the hospital. Your doctor may also give you specific instructions. While your treatment has been planned according to the most current medical practices available, unavoidable complications occasionally occur. If you have any problems or questions after discharge, call Dr. Jena Gauss at [phone]. ACTIVITY You may resume your regular activity, but move at a slower pace for the next 24 hours.  Take frequent rest periods for the next 24 hours.  Walking will help get rid of the air and reduce the bloated feeling in your belly (abdomen).  No driving for 24 hours (because of the medicine (anesthesia) used during the test).   Do not sign any important legal documents or operate any machinery for 24 hours (because of the anesthesia used during the test).  NUTRITION Drink plenty of fluids.  You may resume your normal diet as instructed by your doctor.  Begin with a light meal and progress to your normal diet. Heavy or fried foods are harder to digest and may make you feel sick to your stomach (nauseated).  Avoid alcoholic beverages for 24 hours or as instructed.  MEDICATIONS You may resume your normal medications unless your doctor tells you otherwise.  WHAT YOU CAN EXPECT TODAY Some feelings of bloating in the abdomen.  Passage of more gas than usual.  Spotting of blood in your stool or on the toilet paper.  IF YOU HAD POLYPS REMOVED DURING THE COLONOSCOPY: No aspirin products for 7 days or as instructed.  No alcohol for 7 days or as instructed.  Eat a soft diet for the next 24 hours.  FINDING OUT THE RESULTS OF YOUR TEST Not all test results are available during your visit. If your test results are not back during the visit, make an appointment with your caregiver to find out the  results. Do not assume everything is normal if you have not heard from your caregiver or the medical facility. It is important for you to follow up on all of your test results.  SEEK IMMEDIATE MEDICAL ATTENTION IF: You have more than a spotting of blood in your stool.  Your belly is swollen (abdominal distention).  You are nauseated or vomiting.  You have a temperature over 101.  You have abdominal pain or discomfort that is severe or gets worse throughout the day.       Diverticulosis present.  Diverticulosis information provided  1 tiny polyp in your colon removed and destroyed   a future colonoscopy is not recommended unless new symptoms develop  At patient request, called Nonie Hoyer at [phone]

## 2023-03-14 NOTE — Interval H&P Note (Signed)
History and Physical Interval Note:  03/14/2023 12:44 PM  Sue Evans  has presented today for surgery, with the diagnosis of screening.  The various methods of treatment have been discussed with the patient and family. After consideration of risks, benefits and other options for treatment, the patient has consented to  Procedure(s) with comments: COLONOSCOPY WITH PROPOFOL (N/A) - 1:15pm, asa 2 as a surgical intervention.  The patient's history has been reviewed, patient examined, no change in status, stable for surgery.  I have reviewed the patient's chart and labs.  Questions were answered to the patient's satisfaction.     Alinah Sheard    No change.  Screening colonoscopy today per plan.  The risks, benefits, limitations, alternatives and imponderables have been reviewed with the patient. Questions have been answered. All parties are agreeable.

## 2023-03-14 NOTE — Anesthesia Preprocedure Evaluation (Addendum)
Anesthesia Evaluation  Patient identified by MRN, date of birth, ID band Patient awake    Reviewed: Allergy & Precautions, H&P , NPO status , Patient's Chart, lab work & pertinent test results, reviewed documented beta blocker date and time   Airway Mallampati: II  TM Distance: >3 FB Neck ROM: full    Dental no notable dental hx. (+) Dental Advisory Given   Pulmonary neg pulmonary ROS   Pulmonary exam normal breath sounds clear to auscultation       Cardiovascular Exercise Tolerance: Good negative cardio ROS  Rhythm:regular Rate:Normal     Neuro/Psych  Headaches  negative psych ROS   GI/Hepatic negative GI ROS, Neg liver ROS,,,  Endo/Other  negative endocrine ROS    Renal/GU negative Renal ROS  negative genitourinary   Musculoskeletal negative musculoskeletal ROS (+)    Abdominal   Peds  Hematology negative hematology ROS (+)   Anesthesia Other Findings   Reproductive/Obstetrics negative OB ROS                             Anesthesia Physical Anesthesia Plan  ASA: 2  Anesthesia Plan: General   Post-op Pain Management: Minimal or no pain anticipated   Induction: Intravenous  PONV Risk Score and Plan: Propofol infusion  Airway Management Planned: Nasal Cannula and Natural Airway  Additional Equipment: None  Intra-op Plan:   Post-operative Plan:   Informed Consent: I have reviewed the patients History and Physical, chart, labs and discussed the procedure including the risks, benefits and alternatives for the proposed anesthesia with the patient or authorized representative who has indicated his/her understanding and acceptance.     Dental Advisory Given  Plan Discussed with: CRNA  Anesthesia Plan Comments:        Anesthesia Quick Evaluation

## 2023-03-14 NOTE — Transfer of Care (Signed)
Immediate Anesthesia Transfer of Care Note  Patient: Sue Evans  Procedure(s) Performed: COLONOSCOPY WITH PROPOFOL  Patient Location: Short Stay  Anesthesia Type:General  Level of Consciousness: awake, alert , oriented, and patient cooperative  Airway & Oxygen Therapy: Patient Spontanous Breathing  Post-op Assessment: Report given to RN, Post -op Vital signs reviewed and stable, and Patient moving all extremities X 4  Post vital signs: Reviewed and stable  Last Vitals:  Vitals Value Taken Time  BP 99/56 03/14/23 1319  Temp 36.5 C 03/14/23 1319  Pulse 70 03/14/23 1319  Resp 17 03/14/23 1319  SpO2 96 % 03/14/23 1319    Last Pain:  Vitals:   03/14/23 1319  TempSrc: Axillary  PainSc: 0-No pain      Patients Stated Pain Goal: 3 (03/14/23 1140)  Complications: No notable events documented.

## 2023-03-14 NOTE — Op Note (Signed)
[facility] Patient Name: Sue Evans Procedure Date: 03/14/2023 12:43 PM MRN: [id number] Date of Birth: August 23, 1945 Attending MD: Gennette Pac , MD, [id number] CSN: [id number] Age: 77 Admit Type: Outpatient Procedure:                Colonoscopy Indications:              Screening for colorectal malignant neoplasm Providers:                Gennette Pac, MD, Edrick Kins, RN,                            Zena Amos Referring MD:             Gennette Pac, MD Medicines:                Propofol per Anesthesia Complications:            No immediate complications. Estimated Blood Loss:     Estimated blood loss was minimal. Procedure:                Pre-Anesthesia Assessment:                           - Prior to the procedure, a History and Physical                            was performed, and patient medications and                            allergies were reviewed. The patient's tolerance of                            previous anesthesia was also reviewed. The risks                            and benefits of the procedure and the sedation                            options and risks were discussed with the patient.                            All questions were answered, and informed consent                            was obtained. Prior Anticoagulants: The patient has                            taken no anticoagulant or antiplatelet agents. ASA                            Grade Assessment: III - A patient with severe                            systemic disease. After reviewing the risks and  benefits, the patient was deemed in satisfactory                            condition to undergo the procedure.                           After obtaining informed consent, the colonoscope                            was passed under direct vision. Throughout the                            procedure, the patient's blood pressure, pulse, and                             oxygen saturations were monitored continuously. The                            [id number]) scope was introduced through the                            anus and advanced to the the cecum, identified by                            appendiceal orifice and ileocecal valve. The                            colonoscopy was performed without difficulty. The                            patient tolerated the procedure well. The quality                            of the bowel preparation was adequate. The                            ileocecal valve, appendiceal orifice, and rectum                            were photographed. The patient tolerated the                            procedure well. The quality of the bowel                            preparation was adequate. The ileocecal valve,                            appendiceal orifice, and rectum were photographed. Scope In: 12:56:28 PM Scope Out: 1:15:56 PM Scope Withdrawal Time: 0 hours 9 minutes 43 seconds  Total Procedure Duration: 0 hours 19 minutes 28 seconds  Findings:      The perianal and digital rectal examinations were normal.      Scattered small-mouthed diverticula were found in the sigmoid colon.      Marland Kitchen  A 2 mm polyp was found in the cecum. The polyp was sessile. The polyp       was removed with a cold snare. Resection Complete/ablated.      The exam was otherwise without abnormality on direct and retroflexion       views. Impression:               - Diverticulosis in the sigmoid colon.                           - One 2 mm polyp in the cecum, removed with a cold                            snare. Resected /ablated?"no specimen. Moderate Sedation:      Moderate (conscious) sedation was personally administered by an       anesthesia professional. The following parameters were monitored: oxygen       saturation, heart rate, blood pressure, respiratory rate, EKG, adequacy       of pulmonary ventilation, and  response to care. Recommendation:           - Patient has a contact number available for                            emergencies. The signs and symptoms of potential                            delayed complications were discussed with the                            patient. Return to normal activities tomorrow.                            Written discharge instructions were provided to the                            patient.                           - Advance diet as tolerated.                           - No repeat colonoscopy.                           - Return to GI clinic PRN. Procedure Code(s):        --- Professional ---                           [phone], Colonoscopy, flexible; with removal of                            tumor(s), polyp(s), or other lesion(s) by snare                            technique Diagnosis Code(s):        --- Professional ---  Z12.11, Encounter for screening for malignant                            neoplasm of colon                           D12.0, Benign neoplasm of cecum                           K57.30, Diverticulosis of large intestine without                            perforation or abscess without bleeding CPT copyright 2022 [employer]. All rights reserved. The codes documented in this report are preliminary and upon coder review may  be revised to meet current compliance requirements. Gerrit Friends. Lorey Pallett, MD Gennette Pac, MD 03/14/2023 1:26:56 PM This report has been signed electronically. Number of Addenda: 0

## 2023-03-21 ENCOUNTER — Encounter (HOSPITAL_COMMUNITY): Payer: Self-pay | Admitting: Internal Medicine

## 2023-03-29 DIAGNOSIS — R928 Other abnormal and inconclusive findings on diagnostic imaging of breast: Secondary | ICD-10-CM | POA: Diagnosis not present

## 2023-03-29 DIAGNOSIS — M1991 Primary osteoarthritis, unspecified site: Secondary | ICD-10-CM | POA: Diagnosis not present

## 2023-03-29 DIAGNOSIS — R058 Other specified cough: Secondary | ICD-10-CM | POA: Diagnosis not present

## 2023-03-29 DIAGNOSIS — E039 Hypothyroidism, unspecified: Secondary | ICD-10-CM | POA: Diagnosis not present

## 2023-03-29 DIAGNOSIS — I10 Essential (primary) hypertension: Secondary | ICD-10-CM | POA: Diagnosis not present

## 2023-03-29 DIAGNOSIS — Z0001 Encounter for general adult medical examination with abnormal findings: Secondary | ICD-10-CM | POA: Diagnosis not present

## 2023-03-29 DIAGNOSIS — D518 Other vitamin B12 deficiency anemias: Secondary | ICD-10-CM | POA: Diagnosis not present

## 2023-03-29 DIAGNOSIS — Z6823 Body mass index (BMI) 23.0-23.9, adult: Secondary | ICD-10-CM | POA: Diagnosis not present

## 2023-03-29 DIAGNOSIS — F5102 Adjustment insomnia: Secondary | ICD-10-CM | POA: Diagnosis not present

## 2023-03-29 DIAGNOSIS — E559 Vitamin D deficiency, unspecified: Secondary | ICD-10-CM | POA: Diagnosis not present

## 2023-05-30 ENCOUNTER — Other Ambulatory Visit (HOSPITAL_COMMUNITY): Payer: Self-pay | Admitting: Internal Medicine

## 2023-05-30 DIAGNOSIS — Z1231 Encounter for screening mammogram for malignant neoplasm of breast: Secondary | ICD-10-CM

## 2023-06-13 ENCOUNTER — Ambulatory Visit (HOSPITAL_COMMUNITY)
Admission: RE | Admit: 2023-06-13 | Discharge: 2023-06-13 | Disposition: A | Discharge status: Home / Self Care | Payer: Medicare PPO | Source: Ambulatory Visit | Attending: Internal Medicine | Admitting: Internal Medicine

## 2023-06-13 DIAGNOSIS — Z1231 Encounter for screening mammogram for malignant neoplasm of breast: Secondary | ICD-10-CM | POA: Insufficient documentation

## 2023-07-12 DIAGNOSIS — L570 Actinic keratosis: Secondary | ICD-10-CM | POA: Diagnosis not present

## 2023-07-12 DIAGNOSIS — L814 Other melanin hyperpigmentation: Secondary | ICD-10-CM | POA: Diagnosis not present

## 2023-07-12 DIAGNOSIS — Z1283 Encounter for screening for malignant neoplasm of skin: Secondary | ICD-10-CM | POA: Diagnosis not present

## 2023-07-12 DIAGNOSIS — I781 Nevus, non-neoplastic: Secondary | ICD-10-CM | POA: Diagnosis not present

## 2023-10-20 DIAGNOSIS — Z6824 Body mass index (BMI) 24.0-24.9, adult: Secondary | ICD-10-CM | POA: Diagnosis not present

## 2023-10-20 DIAGNOSIS — R030 Elevated blood-pressure reading, without diagnosis of hypertension: Secondary | ICD-10-CM | POA: Diagnosis not present

## 2023-10-20 DIAGNOSIS — I10 Essential (primary) hypertension: Secondary | ICD-10-CM | POA: Diagnosis not present

## 2023-11-22 DIAGNOSIS — Z6824 Body mass index (BMI) 24.0-24.9, adult: Secondary | ICD-10-CM | POA: Diagnosis not present

## 2023-11-22 DIAGNOSIS — I10 Essential (primary) hypertension: Secondary | ICD-10-CM | POA: Diagnosis not present

## 2023-11-22 DIAGNOSIS — E785 Hyperlipidemia, unspecified: Secondary | ICD-10-CM | POA: Diagnosis not present

## 2023-11-22 DIAGNOSIS — G4700 Insomnia, unspecified: Secondary | ICD-10-CM | POA: Diagnosis not present

## 2023-11-22 DIAGNOSIS — H9201 Otalgia, right ear: Secondary | ICD-10-CM | POA: Diagnosis not present

## 2023-12-19 DIAGNOSIS — E559 Vitamin D deficiency, unspecified: Secondary | ICD-10-CM | POA: Diagnosis not present

## 2023-12-19 DIAGNOSIS — Z1329 Encounter for screening for other suspected endocrine disorder: Secondary | ICD-10-CM | POA: Diagnosis not present

## 2023-12-19 DIAGNOSIS — E785 Hyperlipidemia, unspecified: Secondary | ICD-10-CM | POA: Diagnosis not present

## 2023-12-19 DIAGNOSIS — D559 Anemia due to enzyme disorder, unspecified: Secondary | ICD-10-CM | POA: Diagnosis not present

## 2023-12-19 DIAGNOSIS — Z131 Encounter for screening for diabetes mellitus: Secondary | ICD-10-CM | POA: Diagnosis not present

## 2023-12-19 DIAGNOSIS — Z1321 Encounter for screening for nutritional disorder: Secondary | ICD-10-CM | POA: Diagnosis not present

## 2023-12-19 DIAGNOSIS — I10 Essential (primary) hypertension: Secondary | ICD-10-CM | POA: Diagnosis not present

## 2023-12-26 DIAGNOSIS — E785 Hyperlipidemia, unspecified: Secondary | ICD-10-CM | POA: Diagnosis not present

## 2023-12-26 DIAGNOSIS — Z6824 Body mass index (BMI) 24.0-24.9, adult: Secondary | ICD-10-CM | POA: Diagnosis not present

## 2023-12-26 DIAGNOSIS — Z1389 Encounter for screening for other disorder: Secondary | ICD-10-CM | POA: Diagnosis not present

## 2023-12-26 DIAGNOSIS — Z1331 Encounter for screening for depression: Secondary | ICD-10-CM | POA: Diagnosis not present

## 2023-12-26 DIAGNOSIS — I10 Essential (primary) hypertension: Secondary | ICD-10-CM | POA: Diagnosis not present

## 2023-12-26 DIAGNOSIS — Z0001 Encounter for general adult medical examination with abnormal findings: Secondary | ICD-10-CM | POA: Diagnosis not present

## 2023-12-26 DIAGNOSIS — G4700 Insomnia, unspecified: Secondary | ICD-10-CM | POA: Diagnosis not present

## 2023-12-26 DIAGNOSIS — Z8742 Personal history of other diseases of the female genital tract: Secondary | ICD-10-CM | POA: Diagnosis not present

## 2024-01-12 DIAGNOSIS — H40012 Open angle with borderline findings, low risk, left eye: Secondary | ICD-10-CM | POA: Diagnosis not present

## 2024-02-08 DIAGNOSIS — Z01419 Encounter for gynecological examination (general) (routine) without abnormal findings: Secondary | ICD-10-CM | POA: Diagnosis not present

## 2024-05-25 ENCOUNTER — Other Ambulatory Visit (HOSPITAL_COMMUNITY): Payer: Self-pay | Admitting: Internal Medicine

## 2024-05-25 DIAGNOSIS — Z1231 Encounter for screening mammogram for malignant neoplasm of breast: Secondary | ICD-10-CM

## 2024-06-13 ENCOUNTER — Ambulatory Visit (HOSPITAL_COMMUNITY)
Admission: RE | Admit: 2024-06-13 | Discharge: 2024-06-13 | Disposition: A | Discharge status: Home / Self Care | Source: Ambulatory Visit | Attending: Internal Medicine | Admitting: Internal Medicine

## 2024-06-13 ENCOUNTER — Encounter (HOSPITAL_COMMUNITY): Payer: Self-pay

## 2024-06-13 DIAGNOSIS — Z1231 Encounter for screening mammogram for malignant neoplasm of breast: Secondary | ICD-10-CM | POA: Diagnosis present
